# Patient Record
Sex: Male | Born: 1990
Health system: Southern US, Community
[De-identification: ages and names within clinical notes are randomized; demographics above are authoritative.]

## PROBLEM LIST (undated history)

## (undated) DIAGNOSIS — F419 Anxiety disorder, unspecified: Secondary | ICD-10-CM

## (undated) DIAGNOSIS — F32A Depression, unspecified: Secondary | ICD-10-CM

## (undated) HISTORY — DX: Depression, unspecified: F32.A

## (undated) HISTORY — DX: Anxiety disorder, unspecified: F41.9

## (undated) HISTORY — PX: KNEE SURGERY: SHX244

---
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
## 2020-08-23 ENCOUNTER — Other Ambulatory Visit: Payer: Self-pay

## 2020-08-23 ENCOUNTER — Encounter: Payer: Self-pay | Admitting: Family Medicine

## 2020-08-23 ENCOUNTER — Ambulatory Visit (INDEPENDENT_AMBULATORY_CARE_PROVIDER_SITE_OTHER): Payer: BC Managed Care – PPO | Admitting: Family Medicine

## 2020-08-23 VITALS — BP 110/74 | HR 75 | Temp 97.7°F | Ht 69.0 in | Wt 244.0 lb

## 2020-08-23 DIAGNOSIS — K645 Perianal venous thrombosis: Secondary | ICD-10-CM | POA: Diagnosis not present

## 2020-08-23 MED ORDER — HYDROCORTISONE (PERIANAL) 2.5 % EX CREA
1.0000 | TOPICAL_CREAM | Freq: Two times a day (BID) | CUTANEOUS | 2 refills | Status: DC
Start: 2020-08-23 — End: 2022-03-27

## 2020-08-23 MED ORDER — WITCH HAZEL-GLYCERIN EX PADS: MEDICATED_PAD | CUTANEOUS | 12 refills | Status: DC

## 2020-08-23 NOTE — Progress Notes (Signed)
Established Patient Office Visit  Subjective:  Patient ID: Edward Yu, male    DOB: 1990-09-19  Age: 30 y.o. MRN: [id number]  CC:  Chief Complaint  Patient presents with   Establish Care    NP/establish care bump at rectum x 4 days tender to touch, becoming larger in size black in color.     HPI Edward Yu presents for evaluation of a 4-day history of a tender swelling in his rectum.  Developed severe pain last night and felt a pop and the pain was relieved.  Patient denies prolonged toilet bowl setting or constipation.  He stools up to 4 times daily.  Denies hematochezia or black tarry stools.  Denies weight loss.  Denies crampy abdominal pain.  Stools are formed.  Denies painful bowel movements other than with a tender swelling.  History reviewed. No pertinent past medical history.  Past Surgical History:  Procedure Laterality Date   KNEE SURGERY Right     Family History  Problem Relation Age of Onset   Arthritis Mother    Hernia Father    Hypertension Maternal Grandmother    Heart disease Maternal Grandmother    Thyroid disease Maternal Grandmother    Depression Paternal Grandmother    Kidney disease Paternal Grandmother    Juvenile Rhematoid Arthritis Paternal Grandmother     Social History   Socioeconomic History   Marital status: Single    Spouse name: Not on file   Number of children: Not on file   Years of education: Not on file   Highest education level: Not on file  Occupational History   Not on file  Tobacco Use   Smoking status: Never   Smokeless tobacco: Never  Vaping Use   Vaping Use: Never used  Substance and Sexual Activity   Alcohol use: Yes    Comment: rare   Drug use: Never   Sexual activity: Yes  Other Topics Concern   Not on file  Social History Narrative   Not on file   Social Determinants of Health   Financial Resource Strain: Not on file  Food Insecurity: Not on file  Transportation Needs: Not on file  Physical  Activity: Not on file  Stress: Not on file  Social Connections: Not on file  Intimate Partner Violence: Not on file    No outpatient medications prior to visit.   No facility-administered medications prior to visit.    Not on File  ROS Review of Systems  Constitutional: Negative.   Respiratory: Negative.    Cardiovascular: Negative.   Gastrointestinal: Negative.  Negative for abdominal pain, anal bleeding, blood in stool, constipation and diarrhea.  Psychiatric/Behavioral: Negative.       Objective:    Physical Exam Vitals and nursing note reviewed.  Constitutional:      General: He is not in acute distress.    Appearance: Normal appearance. He is obese. He is not ill-appearing, toxic-appearing or diaphoretic.  HENT:     Head: Normocephalic and atraumatic.     Right Ear: External ear normal.     Left Ear: External ear normal.  Eyes:     General: No scleral icterus.       Right eye: No discharge.        Left eye: No discharge.     Extraocular Movements: Extraocular movements intact.     Conjunctiva/sclera: Conjunctivae normal.  Cardiovascular:     Rate and Rhythm: Normal rate and regular rhythm.  Pulmonary:  Effort: Pulmonary effort is normal.     Breath sounds: Normal breath sounds.  Abdominal:     General: Bowel sounds are normal. There is no distension.     Palpations: Abdomen is soft. There is no mass.     Tenderness: There is no abdominal tenderness. There is no guarding or rebound.     Hernia: No hernia is present.  Genitourinary:   Skin:    General: Skin is warm and dry.  Neurological:     Mental Status: He is alert and oriented to person, place, and time.  Psychiatric:        Mood and Affect: Mood normal.        Behavior: Behavior normal.    BP 110/74 (BP Location: Left Arm, Patient Position: Sitting, Cuff Size: Normal)   Pulse 75   Temp 97.7 F (36.5 C) (Temporal)   Ht '5\' 9"'  (1.753 m) Comment: 5 9 1/2  Wt 244 lb (110.7 kg)   SpO2 98%    BMI 36.03 kg/m  Wt Readings from Last 3 Encounters:  08/23/20 244 lb (110.7 kg)     Health Maintenance Due  Topic Date Due   HIV Screening  Never done   Hepatitis C Screening  Never done   INFLUENZA VACCINE  08/06/2020    There are no preventive care reminders to display for this patient.  No results found for: TSH No results found for: WBC, HGB, HCT, MCV, PLT No results found for: NA, K, CHLORIDE, CO2, GLUCOSE, BUN, CREATININE, BILITOT, ALKPHOS, AST, ALT, PROT, ALBUMIN, CALCIUM, ANIONGAP, EGFR, GFR No results found for: CHOL No results found for: HDL No results found for: LDLCALC No results found for: TRIG No results found for: CHOLHDL No results found for: HGBA1C    Assessment & Plan:   Problem List Items Addressed This Visit       Cardiovascular and Mediastinum   Thrombosed hemorrhoids - Primary   Relevant Medications   hydrocortisone (ANUSOL-HC) 2.5 % rectal cream   witch hazel-glycerin (TUCKS) pad   Other Relevant Orders   Ambulatory referral to Gastroenterology    Meds ordered this encounter  Medications   hydrocortisone (ANUSOL-HC) 2.5 % rectal cream    Sig: Place 1 application rectally 2 (two) times daily.    Dispense:  30 g    Refill:  2   witch hazel-glycerin (TUCKS) pad    Sig: Use after stooling.    Dispense:  40 each    Refill:  12    Follow-up: No follow-ups on file.   We will treat with Anusol cream with hydrocortisone and witch hazel pads after stooling.  Difficult to correlate hemorrhoid with frequent stooling other than frequent stooling could lead to prolonged toilet bowl sitting.  Will return for physical. Edward Maw, MD

## 2020-10-31 ENCOUNTER — Encounter: Payer: BC Managed Care – PPO | Admitting: Family Medicine

## 2021-03-14 ENCOUNTER — Ambulatory Visit (INDEPENDENT_AMBULATORY_CARE_PROVIDER_SITE_OTHER): Payer: BC Managed Care – PPO | Admitting: Nurse Practitioner

## 2021-03-14 ENCOUNTER — Encounter: Payer: Self-pay | Admitting: Nurse Practitioner

## 2021-03-14 ENCOUNTER — Other Ambulatory Visit: Payer: Self-pay

## 2021-03-14 VITALS — BP 118/72 | HR 55 | Temp 98.5°F | Wt 227.2 lb

## 2021-03-14 DIAGNOSIS — J029 Acute pharyngitis, unspecified: Secondary | ICD-10-CM | POA: Insufficient documentation

## 2021-03-14 DIAGNOSIS — J020 Streptococcal pharyngitis: Secondary | ICD-10-CM

## 2021-03-14 LAB — POCT RAPID STREP A (OFFICE): Rapid Strep A Screen: POSITIVE — AB

## 2021-03-14 LAB — POCT INFLUENZA A/B
Influenza A, POC: NEGATIVE
Influenza B, POC: NEGATIVE

## 2021-03-14 LAB — POC COVID19 BINAXNOW: SARS Coronavirus 2 Ag: NEGATIVE

## 2021-03-14 MED ORDER — AMOXICILLIN 500 MG PO CAPS: 500.0000 mg | ORAL_CAPSULE | Freq: Two times a day (BID) | ORAL | 0 refills | Status: AC

## 2021-03-14 NOTE — Patient Instructions (Signed)
It was great to see you! ? ?I will call you with the results of your strep, flu, and covid-19 tests. Keep taking ibuprofen as needed for pain. You can take benadryl (may make you sleepy) or tylenol cold and sinus or dayquil (or store brand) during the day.  ? ?Let's follow-up if your symptoms don't improve or worsen.  ? ?Take care, ? ?Rodman Pickle, NP ? ?

## 2021-03-14 NOTE — Progress Notes (Signed)
? ?Acute Office Visit ? ?Subjective:  ? ? Patient ID: Edward Yu, male    DOB: 1990-12-28, 31 y.o.   MRN: [id number] ? ?Chief Complaint  ?Patient presents with  ? Sinusitis  ?  Pt c/o sinus pressure, headache, body aches, fever, body sweats all started today  ? ? ?HPI ?Patient is in today for fever, body aches, and sinus pressure since today. ? ?UPPER RESPIRATORY TRACT INFECTION ? ?Fever: yes 103 ?Cough: no ?Shortness of breath: no ?Wheezing: no ?Chest pain: no ?Chest tightness: no ?Chest congestion: no ?Nasal congestion: yes ?Runny nose: no ?Post nasal drip: no ?Sneezing: no ?Sore throat: yes ?Swollen glands: no ?Sinus pressure: yes ?Headache: yes ?Face pain: no ?Toothache: no ?Ear pain: no bilateral ?Ear pressure: yes bilateral ?Eyes red/itching:no ?Eye drainage/crusting: no  ?Vomiting: no ?Rash: no ?Fatigue: yes ?Sick contacts: no ?Strep contacts: yes - daughter ?Context: stable ?Recurrent sinusitis: no ?Relief with OTC cold/cough medications: yes  ?Treatments attempted: benadryl, ibuprofen  ? ?History reviewed. No pertinent past medical history. ? ?Past Surgical History:  ?Procedure Laterality Date  ? KNEE SURGERY Right   ? ? ?Family History  ?Problem Relation Age of Onset  ? Arthritis Mother   ? Hernia Father   ? Hypertension Maternal Grandmother   ? Heart disease Maternal Grandmother   ? Thyroid disease Maternal Grandmother   ? Depression Paternal Grandmother   ? Kidney disease Paternal Grandmother   ? Juvenile Rhematoid Arthritis Paternal Grandmother   ? ? ?Social History  ? ?Socioeconomic History  ? Marital status: Single  ?  Spouse name: Not on file  ? Number of children: Not on file  ? Years of education: Not on file  ? Highest education level: Not on file  ?Occupational History  ? Not on file  ?Tobacco Use  ? Smoking status: Never  ? Smokeless tobacco: Never  ?Vaping Use  ? Vaping Use: Never used  ?Substance and Sexual Activity  ? Alcohol use: Yes  ?  Comment: rare  ? Drug use: Never  ? Sexual  activity: Yes  ?Other Topics Concern  ? Not on file  ?Social History Narrative  ? Not on file  ? ?Social Determinants of Health  ? ?Financial Resource Strain: Not on file  ?Food Insecurity: Not on file  ?Transportation Needs: Not on file  ?Physical Activity: Not on file  ?Stress: Not on file  ?Social Connections: Not on file  ?Intimate Partner Violence: Not on file  ? ? ?Outpatient Medications Prior to Visit  ?Medication Sig Dispense Refill  ? hydrocortisone (ANUSOL-HC) 2.5 % rectal cream Place 1 application rectally 2 (two) times daily. 30 g 2  ? witch hazel-glycerin (TUCKS) pad Use after stooling. 40 each 12  ? ?No facility-administered medications prior to visit.  ? ? ?Not on File ? ?Review of Systems ?See pertinent positives and negatives per HPI. ?   ?Objective:  ?  ?Physical Exam ?Vitals and nursing note reviewed.  ?Constitutional:   ?   Appearance: Normal appearance.  ?HENT:  ?   Head: Normocephalic.  ?   Right Ear: Tympanic membrane, ear canal and external ear normal.  ?   Left Ear: Tympanic membrane, ear canal and external ear normal.  ?   Mouth/Throat:  ?   Pharynx: Posterior oropharyngeal erythema present. No oropharyngeal exudate.  ?Eyes:  ?   Conjunctiva/sclera: Conjunctivae normal.  ?Cardiovascular:  ?   Rate and Rhythm: Normal rate and regular rhythm.  ?   Pulses: Normal pulses.  ?  Heart sounds: Normal heart sounds.  ?Pulmonary:  ?   Effort: Pulmonary effort is normal.  ?   Breath sounds: Normal breath sounds.  ?Musculoskeletal:  ?   Cervical back: Normal range of motion and neck supple. No tenderness.  ?Lymphadenopathy:  ?   Cervical: No cervical adenopathy.  ?Skin: ?   General: Skin is warm.  ?Neurological:  ?   General: No focal deficit present.  ?   Mental Status: He is alert and oriented to person, place, and time.  ?Psychiatric:     ?   Mood and Affect: Mood normal.     ?   Behavior: Behavior normal.     ?   Thought Content: Thought content normal.     ?   Judgment: Judgment normal.  ? ? ?BP  118/72 (BP Location: Right Arm, Cuff Size: Normal)   Pulse (!) 55   Temp 98.5 ?F (36.9 ?C) (Temporal)   Wt 227 lb 3.2 oz (103.1 kg)   SpO2 98%   BMI 33.55 kg/m?  ?Wt Readings from Last 3 Encounters:  ?03/14/21 227 lb 3.2 oz (103.1 kg)  ?08/23/20 244 lb (110.7 kg)  ? ? ?Health Maintenance Due  ?Topic Date Due  ? HIV Screening  Never done  ? Hepatitis C Screening  Never done  ? INFLUENZA VACCINE  Never done  ? ? ?There are no preventive care reminders to display for this patient. ? ? ?No results found for: TSH ?No results found for: WBC, HGB, HCT, MCV, PLT ?No results found for: NA, K, CHLORIDE, CO2, GLUCOSE, BUN, CREATININE, BILITOT, ALKPHOS, AST, ALT, PROT, ALBUMIN, CALCIUM, ANIONGAP, EGFR, GFR ?No results found for: CHOL ?No results found for: HDL ?No results found for: [place] ?No results found for: TRIG ?No results found for: CHOLHDL ?No results found for: HGBA1C ? ?   ?Assessment & Plan:  ? ?Problem List Items Addressed This Visit   ? ?  ? Other  ? RESOLVED: Sore throat  ? Relevant Orders  ? POCT Influenza A/B (Completed)  ? POCT rapid strep A (Completed)  ? POC COVID-19 BinaxNow (Completed)  ? ?Other Visit Diagnoses   ? ? Strep pharyngitis    -  Primary  ? Treat with amoxicillin 500 mg twice daily x10 days.  Can take ibuprofen as needed for pain and fever.  He is contagious x24 hours.  F/U if not improving  ? ?  ? ? ? ?Meds ordered this encounter  ?Medications  ? amoxicillin (AMOXIL) 500 MG capsule  ?  Sig: Take 1 capsule (500 mg total) by mouth 2 (two) times daily for 10 days.  ?  Dispense:  20 capsule  ?  Refill:  0  ? ? ? ?Charyl Dancer, NP ? ?

## 2022-01-08 DIAGNOSIS — R278 Other lack of coordination: Secondary | ICD-10-CM | POA: Diagnosis not present

## 2022-01-08 DIAGNOSIS — F800 Phonological disorder: Secondary | ICD-10-CM | POA: Diagnosis not present

## 2022-01-08 DIAGNOSIS — F802 Mixed receptive-expressive language disorder: Secondary | ICD-10-CM | POA: Diagnosis not present

## 2022-01-15 DIAGNOSIS — R278 Other lack of coordination: Secondary | ICD-10-CM | POA: Diagnosis not present

## 2022-01-15 DIAGNOSIS — F802 Mixed receptive-expressive language disorder: Secondary | ICD-10-CM | POA: Diagnosis not present

## 2022-01-15 DIAGNOSIS — F800 Phonological disorder: Secondary | ICD-10-CM | POA: Diagnosis not present

## 2022-01-22 DIAGNOSIS — F800 Phonological disorder: Secondary | ICD-10-CM | POA: Diagnosis not present

## 2022-01-22 DIAGNOSIS — R278 Other lack of coordination: Secondary | ICD-10-CM | POA: Diagnosis not present

## 2022-01-22 DIAGNOSIS — F802 Mixed receptive-expressive language disorder: Secondary | ICD-10-CM | POA: Diagnosis not present

## 2022-02-03 DIAGNOSIS — F802 Mixed receptive-expressive language disorder: Secondary | ICD-10-CM | POA: Diagnosis not present

## 2022-02-03 DIAGNOSIS — R278 Other lack of coordination: Secondary | ICD-10-CM | POA: Diagnosis not present

## 2022-02-03 DIAGNOSIS — F800 Phonological disorder: Secondary | ICD-10-CM | POA: Diagnosis not present

## 2022-02-12 DIAGNOSIS — F802 Mixed receptive-expressive language disorder: Secondary | ICD-10-CM | POA: Diagnosis not present

## 2022-02-12 DIAGNOSIS — R278 Other lack of coordination: Secondary | ICD-10-CM | POA: Diagnosis not present

## 2022-02-12 DIAGNOSIS — F800 Phonological disorder: Secondary | ICD-10-CM | POA: Diagnosis not present

## 2022-02-17 DIAGNOSIS — F800 Phonological disorder: Secondary | ICD-10-CM | POA: Diagnosis not present

## 2022-02-17 DIAGNOSIS — R278 Other lack of coordination: Secondary | ICD-10-CM | POA: Diagnosis not present

## 2022-02-17 DIAGNOSIS — F802 Mixed receptive-expressive language disorder: Secondary | ICD-10-CM | POA: Diagnosis not present

## 2022-03-05 DIAGNOSIS — F800 Phonological disorder: Secondary | ICD-10-CM | POA: Diagnosis not present

## 2022-03-05 DIAGNOSIS — F802 Mixed receptive-expressive language disorder: Secondary | ICD-10-CM | POA: Diagnosis not present

## 2022-03-05 DIAGNOSIS — R278 Other lack of coordination: Secondary | ICD-10-CM | POA: Diagnosis not present

## 2022-03-17 DIAGNOSIS — F802 Mixed receptive-expressive language disorder: Secondary | ICD-10-CM | POA: Diagnosis not present

## 2022-03-17 DIAGNOSIS — F800 Phonological disorder: Secondary | ICD-10-CM | POA: Diagnosis not present

## 2022-03-27 ENCOUNTER — Ambulatory Visit: Payer: BC Managed Care – PPO | Admitting: Family Medicine

## 2022-03-27 ENCOUNTER — Encounter: Payer: Self-pay | Admitting: Family Medicine

## 2022-03-27 VITALS — BP 120/72 | HR 79 | Temp 98.7°F | Ht 69.0 in | Wt 244.6 lb

## 2022-03-27 DIAGNOSIS — J029 Acute pharyngitis, unspecified: Secondary | ICD-10-CM

## 2022-03-27 DIAGNOSIS — B349 Viral infection, unspecified: Secondary | ICD-10-CM | POA: Diagnosis not present

## 2022-03-27 LAB — POCT RAPID STREP A (OFFICE): Rapid Strep A Screen: NEGATIVE

## 2022-03-27 NOTE — Progress Notes (Signed)
Established Patient Office Visit   Subjective:  Patient ID: Edward Yu, male    DOB: September 03, 1990  Age: 32 y.o. MRN: [id number]  Chief Complaint  Patient presents with   Fever    Fever, cough, sore throat symptoms x 2 days. Lots of drainage and pressure     Fever  Associated symptoms include congestion, coughing, headaches, nausea and a sore throat. Pertinent negatives include no vomiting or wheezing.   Encounter Diagnoses  Name Primary?   Viral syndrome Yes   Pharyngitis, unspecified etiology    2-day history of congestion postnasal drip sore throat and cough.  Earlier in the illness he had run a fever that responded to Tylenol and Advil.  He had experienced a headache with myalgias and arthralgias.  There have also been some nausea with the fever.  Fever has since broken.   Review of Systems  Constitutional:  Positive for fever and malaise/fatigue.  HENT:  Positive for congestion and sore throat.   Respiratory:  Positive for cough. Negative for sputum production, shortness of breath and wheezing.   Gastrointestinal:  Positive for nausea. Negative for vomiting.  Musculoskeletal:  Positive for joint pain and myalgias.  Neurological:  Positive for headaches.    No current outpatient medications on file.   Objective:     BP 120/72 (BP Location: Left Arm, Patient Position: Sitting, Cuff Size: Large)   Pulse 79   Temp 98.7 F (37.1 C) (Temporal)   Ht 5\' 9"  (1.753 m)   Wt 244 lb 9.6 oz (110.9 kg)   SpO2 96%   BMI 36.12 kg/m    Physical Exam Constitutional:      General: He is not in acute distress.    Appearance: Normal appearance. He is not ill-appearing, toxic-appearing or diaphoretic.  HENT:     Head: Normocephalic and atraumatic.     Right Ear: Tympanic membrane, ear canal and external ear normal.     Left Ear: Tympanic membrane, ear canal and external ear normal.     Mouth/Throat:     Mouth: Mucous membranes are moist.     Pharynx: Oropharynx is clear. No  oropharyngeal exudate or posterior oropharyngeal erythema.  Eyes:     General: No scleral icterus.       Right eye: No discharge.        Left eye: No discharge.     Extraocular Movements: Extraocular movements intact.     Conjunctiva/sclera: Conjunctivae normal.     Pupils: Pupils are equal, round, and reactive to light.  Cardiovascular:     Rate and Rhythm: Normal rate and regular rhythm.  Pulmonary:     Effort: Pulmonary effort is normal. No respiratory distress.     Breath sounds: Normal breath sounds. No wheezing, rhonchi or rales.  Abdominal:     General: Bowel sounds are normal.  Musculoskeletal:     Cervical back: No rigidity or tenderness.  Skin:    General: Skin is warm and dry.  Neurological:     Mental Status: He is alert and oriented to person, place, and time.  Psychiatric:        Mood and Affect: Mood normal.        Behavior: Behavior normal.      No results found for any visits on 03/27/22.    The ASCVD Risk score (Arnett DK, et al., 2019) failed to calculate for the following reasons:   The 2019 ASCVD risk score is only valid for ages 28 to 58  Assessment & Plan:   Viral syndrome  Pharyngitis, unspecified etiology -     POCT rapid strep A    Return if symptoms worsen or fail to improve.  Rest.  Lots of fluids.  Follow-up in 1 week if not improving.  Libby Maw, MD

## 2022-04-01 ENCOUNTER — Ambulatory Visit: Payer: BC Managed Care – PPO | Admitting: Family Medicine

## 2022-04-01 ENCOUNTER — Encounter: Payer: Self-pay | Admitting: Family Medicine

## 2022-04-01 VITALS — BP 116/70 | HR 95 | Temp 98.7°F | Ht 69.0 in | Wt 245.4 lb

## 2022-04-01 DIAGNOSIS — H6992 Unspecified Eustachian tube disorder, left ear: Secondary | ICD-10-CM | POA: Diagnosis not present

## 2022-04-01 DIAGNOSIS — H6121 Impacted cerumen, right ear: Secondary | ICD-10-CM | POA: Insufficient documentation

## 2022-04-01 MED ORDER — EAR WAX CLEANSING 6.5 % OT KIT: PACK | OTIC | 1 refills | Status: DC

## 2022-04-01 MED ORDER — PREDNISONE 20 MG PO TABS: 20.0000 mg | ORAL_TABLET | Freq: Two times a day (BID) | ORAL | 0 refills | Status: AC

## 2022-04-01 NOTE — Progress Notes (Signed)
Established Patient Office Visit   Subjective:  Patient ID: Edward Yu, male    DOB: Oct 07, 1990  Age: 32 y.o. MRN: [id number]  Chief Complaint  Patient presents with   Sinus Problem    Left ear clogged feels muffled x 2 days, sore throat lots of mucus and sinus drainage x 1 week.     Sinus Problem Associated symptoms include congestion and coughing. Pertinent negatives include no ear pain or headaches.   Encounter Diagnoses  Name Primary?   Dysfunction of left eustachian tube Yes   Excessive cerumen in right ear canal    URI is resolving but he has been left with left ear congestion.  He denies pain, history of otitis media but hearing has been affected.  Denies fevers chills, facial pressure, rhinorrhea or teeth pain.  No difficulty breathing or wheezing   Review of Systems  Constitutional: Negative.   HENT:  Positive for congestion and hearing loss. Negative for ear discharge, ear pain, sinus pain and tinnitus.   Eyes:  Negative for blurred vision, discharge and redness.  Respiratory:  Positive for cough.   Cardiovascular: Negative.   Gastrointestinal:  Negative for abdominal pain.  Genitourinary: Negative.   Musculoskeletal: Negative.  Negative for joint pain and myalgias.  Skin:  Negative for rash.  Neurological:  Negative for tingling, loss of consciousness, weakness and headaches.  Endo/Heme/Allergies:  Negative for polydipsia.      04/01/2022    4:14 PM 03/27/2022    9:44 AM 08/24/2020    8:16 AM  Depression screen PHQ 2/9  Decreased Interest 0 0 0  Down, Depressed, Hopeless 0 0 0  PHQ - 2 Score 0 0 0  Altered sleeping   3  Tired, decreased energy   2  Change in appetite   3  Feeling bad or failure about yourself    0  Trouble concentrating   3  Moving slowly or fidgety/restless   0  Suicidal thoughts   0  PHQ-9 Score   11  Difficult doing work/chores   Somewhat difficult      Current Outpatient Medications:    Carbamide Peroxide-Saline (EAR WAX  CLEANSING) 6.5 % KIT, Follow directions on kit for right ear., Disp: 1 kit, Rfl: 1   predniSONE (DELTASONE) 20 MG tablet, Take 1 tablet (20 mg total) by mouth 2 (two) times daily with a meal for 7 days., Disp: 14 tablet, Rfl: 0   Objective:     BP 116/70 (BP Location: Left Arm, Patient Position: Sitting, Cuff Size: Large)   Pulse 95   Temp 98.7 F (37.1 C) (Temporal)   Ht 5\' 9"  (1.753 m)   Wt 245 lb 6.4 oz (111.3 kg)   SpO2 98%   BMI 36.24 kg/m    Physical Exam Constitutional:      General: He is not in acute distress.    Appearance: Normal appearance. He is not ill-appearing, toxic-appearing or diaphoretic.  HENT:     Head: Normocephalic and atraumatic.     Right Ear: External ear normal. No middle ear effusion. Tympanic membrane is not injected, erythematous or retracted.     Left Ear: External ear normal.  No middle ear effusion. Tympanic membrane is retracted. Tympanic membrane is not injected or erythematous.     Ears:     Comments: There is cerumen in the right ear canal. Eyes:     General: No scleral icterus.       Right eye: No discharge.  Left eye: No discharge.     Extraocular Movements: Extraocular movements intact.     Conjunctiva/sclera: Conjunctivae normal.  Pulmonary:     Effort: Pulmonary effort is normal. No respiratory distress.  Skin:    General: Skin is warm and dry.  Neurological:     Mental Status: He is alert and oriented to person, place, and time.  Psychiatric:        Mood and Affect: Mood normal.        Behavior: Behavior normal.      No results found for any visits on 04/01/22.    The ASCVD Risk score (Arnett DK, et al., 2019) failed to calculate for the following reasons:   The 2019 ASCVD risk score is only valid for ages 8 to 97    Assessment & Plan:   Dysfunction of left eustachian tube -     predniSONE; Take 1 tablet (20 mg total) by mouth 2 (two) times daily with a meal for 7 days.  Dispense: 14 tablet; Refill:  0  Excessive cerumen in right ear canal -     Ear Wax Cleansing; Follow directions on kit for right ear.  Dispense: 1 kit; Refill: 1  Prednisone 20 twice daily for 7 days.  If not improving.  Earwax removal kit right ear.  Return if symptoms worsen or fail to improve.    Libby Maw, MD

## 2022-04-09 DIAGNOSIS — R278 Other lack of coordination: Secondary | ICD-10-CM | POA: Diagnosis not present

## 2022-04-09 DIAGNOSIS — Q8719 Other congenital malformation syndromes predominantly associated with short stature: Secondary | ICD-10-CM | POA: Diagnosis not present

## 2022-04-09 DIAGNOSIS — F800 Phonological disorder: Secondary | ICD-10-CM | POA: Diagnosis not present

## 2022-04-09 DIAGNOSIS — F802 Mixed receptive-expressive language disorder: Secondary | ICD-10-CM | POA: Diagnosis not present

## 2022-04-23 DIAGNOSIS — Q8719 Other congenital malformation syndromes predominantly associated with short stature: Secondary | ICD-10-CM | POA: Diagnosis not present

## 2022-04-23 DIAGNOSIS — F802 Mixed receptive-expressive language disorder: Secondary | ICD-10-CM | POA: Diagnosis not present

## 2022-04-23 DIAGNOSIS — F800 Phonological disorder: Secondary | ICD-10-CM | POA: Diagnosis not present

## 2022-04-23 DIAGNOSIS — R278 Other lack of coordination: Secondary | ICD-10-CM | POA: Diagnosis not present

## 2022-04-28 DIAGNOSIS — F802 Mixed receptive-expressive language disorder: Secondary | ICD-10-CM | POA: Diagnosis not present

## 2022-04-28 DIAGNOSIS — F800 Phonological disorder: Secondary | ICD-10-CM | POA: Diagnosis not present

## 2022-04-28 DIAGNOSIS — Q8719 Other congenital malformation syndromes predominantly associated with short stature: Secondary | ICD-10-CM | POA: Diagnosis not present

## 2022-04-28 DIAGNOSIS — R278 Other lack of coordination: Secondary | ICD-10-CM | POA: Diagnosis not present

## 2022-04-30 DIAGNOSIS — F800 Phonological disorder: Secondary | ICD-10-CM | POA: Diagnosis not present

## 2022-04-30 DIAGNOSIS — F802 Mixed receptive-expressive language disorder: Secondary | ICD-10-CM | POA: Diagnosis not present

## 2022-04-30 DIAGNOSIS — R278 Other lack of coordination: Secondary | ICD-10-CM | POA: Diagnosis not present

## 2022-04-30 DIAGNOSIS — Q8719 Other congenital malformation syndromes predominantly associated with short stature: Secondary | ICD-10-CM | POA: Diagnosis not present

## 2022-05-07 DIAGNOSIS — F800 Phonological disorder: Secondary | ICD-10-CM | POA: Diagnosis not present

## 2022-05-07 DIAGNOSIS — R278 Other lack of coordination: Secondary | ICD-10-CM | POA: Diagnosis not present

## 2022-05-07 DIAGNOSIS — F802 Mixed receptive-expressive language disorder: Secondary | ICD-10-CM | POA: Diagnosis not present

## 2022-05-07 DIAGNOSIS — Q8719 Other congenital malformation syndromes predominantly associated with short stature: Secondary | ICD-10-CM | POA: Diagnosis not present

## 2022-05-12 DIAGNOSIS — F800 Phonological disorder: Secondary | ICD-10-CM | POA: Diagnosis not present

## 2022-05-12 DIAGNOSIS — Q8719 Other congenital malformation syndromes predominantly associated with short stature: Secondary | ICD-10-CM | POA: Diagnosis not present

## 2022-05-12 DIAGNOSIS — F802 Mixed receptive-expressive language disorder: Secondary | ICD-10-CM | POA: Diagnosis not present

## 2022-05-12 DIAGNOSIS — R278 Other lack of coordination: Secondary | ICD-10-CM | POA: Diagnosis not present

## 2022-05-14 DIAGNOSIS — F800 Phonological disorder: Secondary | ICD-10-CM | POA: Diagnosis not present

## 2022-05-14 DIAGNOSIS — Q8719 Other congenital malformation syndromes predominantly associated with short stature: Secondary | ICD-10-CM | POA: Diagnosis not present

## 2022-05-14 DIAGNOSIS — F802 Mixed receptive-expressive language disorder: Secondary | ICD-10-CM | POA: Diagnosis not present

## 2022-05-14 DIAGNOSIS — R278 Other lack of coordination: Secondary | ICD-10-CM | POA: Diagnosis not present

## 2022-05-21 DIAGNOSIS — Q8719 Other congenital malformation syndromes predominantly associated with short stature: Secondary | ICD-10-CM | POA: Diagnosis not present

## 2022-05-21 DIAGNOSIS — F800 Phonological disorder: Secondary | ICD-10-CM | POA: Diagnosis not present

## 2022-05-21 DIAGNOSIS — R278 Other lack of coordination: Secondary | ICD-10-CM | POA: Diagnosis not present

## 2022-05-21 DIAGNOSIS — F802 Mixed receptive-expressive language disorder: Secondary | ICD-10-CM | POA: Diagnosis not present

## 2022-05-28 DIAGNOSIS — F800 Phonological disorder: Secondary | ICD-10-CM | POA: Diagnosis not present

## 2022-05-28 DIAGNOSIS — F802 Mixed receptive-expressive language disorder: Secondary | ICD-10-CM | POA: Diagnosis not present

## 2022-05-28 DIAGNOSIS — R278 Other lack of coordination: Secondary | ICD-10-CM | POA: Diagnosis not present

## 2022-05-28 DIAGNOSIS — Q8719 Other congenital malformation syndromes predominantly associated with short stature: Secondary | ICD-10-CM | POA: Diagnosis not present

## 2022-06-11 DIAGNOSIS — Q8719 Other congenital malformation syndromes predominantly associated with short stature: Secondary | ICD-10-CM | POA: Diagnosis not present

## 2022-06-11 DIAGNOSIS — R278 Other lack of coordination: Secondary | ICD-10-CM | POA: Diagnosis not present

## 2022-06-18 DIAGNOSIS — R278 Other lack of coordination: Secondary | ICD-10-CM | POA: Diagnosis not present

## 2022-06-18 DIAGNOSIS — Q8719 Other congenital malformation syndromes predominantly associated with short stature: Secondary | ICD-10-CM | POA: Diagnosis not present

## 2022-07-02 DIAGNOSIS — R278 Other lack of coordination: Secondary | ICD-10-CM | POA: Diagnosis not present

## 2022-07-02 DIAGNOSIS — Q8719 Other congenital malformation syndromes predominantly associated with short stature: Secondary | ICD-10-CM | POA: Diagnosis not present

## 2022-07-16 DIAGNOSIS — R278 Other lack of coordination: Secondary | ICD-10-CM | POA: Diagnosis not present

## 2022-07-16 DIAGNOSIS — Q8719 Other congenital malformation syndromes predominantly associated with short stature: Secondary | ICD-10-CM | POA: Diagnosis not present

## 2022-07-20 DIAGNOSIS — W01198A Fall on same level from slipping, tripping and stumbling with subsequent striking against other object, initial encounter: Secondary | ICD-10-CM | POA: Diagnosis not present

## 2022-07-20 DIAGNOSIS — S0081XA Abrasion of other part of head, initial encounter: Secondary | ICD-10-CM | POA: Diagnosis not present

## 2022-07-20 DIAGNOSIS — S0993XA Unspecified injury of face, initial encounter: Secondary | ICD-10-CM | POA: Diagnosis not present

## 2022-08-06 DIAGNOSIS — R278 Other lack of coordination: Secondary | ICD-10-CM | POA: Diagnosis not present

## 2022-08-06 DIAGNOSIS — Q8719 Other congenital malformation syndromes predominantly associated with short stature: Secondary | ICD-10-CM | POA: Diagnosis not present

## 2022-08-07 ENCOUNTER — Encounter: Payer: Self-pay | Admitting: Behavioral Health

## 2022-08-07 ENCOUNTER — Ambulatory Visit (INDEPENDENT_AMBULATORY_CARE_PROVIDER_SITE_OTHER): Payer: BC Managed Care – PPO | Admitting: Behavioral Health

## 2022-08-07 VITALS — BP 126/80 | HR 67 | Ht 70.0 in | Wt 230.0 lb

## 2022-08-07 DIAGNOSIS — F331 Major depressive disorder, recurrent, moderate: Secondary | ICD-10-CM | POA: Diagnosis not present

## 2022-08-07 DIAGNOSIS — F411 Generalized anxiety disorder: Secondary | ICD-10-CM | POA: Diagnosis not present

## 2022-08-07 MED ORDER — TRAZODONE HCL 50 MG PO TABS: 50.0000 mg | ORAL_TABLET | Freq: Every day | ORAL | 1 refills | Status: DC

## 2022-08-07 MED ORDER — VILAZODONE HCL 20 MG PO TABS: ORAL_TABLET | ORAL | 1 refills | Status: DC

## 2022-08-07 NOTE — Progress Notes (Signed)
[facility] MD/PA/NP Initial Note  08/07/2022 12:08 PM Edward Yu  MRN:  [id number]  Chief Complaint:  Chief Complaint   Anxiety; Depression; Family Problem; Follow-up; Patient Education     HPI:   "Edward Yu", 32 year old male presents to this office for initial visit and to establish care.  Collateral information should be considered reliable.  Patient is tearful at times.  He says, "my brain runs all the time".  Says that it affects his conversations and he feels disengaged.  Says that he works 60 hours plus weeks and does not have any time for himself.  He is lost interest in a lot of the hobbies that he use to do.  He endorses racing thoughts, trouble concentrating, easily distracted.  Says that he is working on his current 9-year relationship with significant other.  Says there has been some on fake fullness involved in the past and he is not sure that he can overcome his feelings.  Patient says that his anxiety today is 6/10, and depression is 4/10.  Says that his sleep has been inconsistent and poor recently only getting about 5 to 6 hours per night.  His MDQ had 4/14 criteria on marked yes.  His PHQ-9 score was 16.  He denies any history of mania, no psychosis, no auditory or visual hallucinations.  Denies SI or HI.  No past psychiatric medication trials    Visit Diagnosis:    ICD-10-CM   1. Generalized anxiety disorder  F41.1 Vilazodone HCl 20 MG TABS    traZODone (DESYREL) 50 MG tablet    2. Major depressive disorder, recurrent episode, moderate (HCC)  F33.1 Vilazodone HCl 20 MG TABS    traZODone (DESYREL) 50 MG tablet      Past Psychiatric History: none  Past Medical History: No past medical history on file.  Past Surgical History:  Procedure Laterality Date   KNEE SURGERY Right     Family Psychiatric History: see chart  Family History:  Family History  Problem Relation Age of Onset   Arthritis Mother    Anxiety disorder Father    Hernia Father    Bipolar  disorder Sister    Hypertension Maternal Grandmother    Heart disease Maternal Grandmother    Thyroid disease Maternal Grandmother    Depression Paternal Grandmother    Kidney disease Paternal Grandmother    Juvenile Rhematoid Arthritis Paternal Grandmother     Social History:  Social History   Socioeconomic History   Marital status: Significant Other    Spouse name: Edward Yu   Number of children: 2   Years of education: 12   Highest education level: High school graduate  Occupational History   Occupation: Works at [employer] and [employer]  Tobacco Use   Smoking status: Never   Smokeless tobacco: Never  Vaping Use   Vaping status: Never Used  Substance and Sexual Activity   Alcohol use: Yes    Comment: rare   Drug use: Never   Sexual activity: Yes  Other Topics Concern   Not on file  Social History Narrative   Lives in [place] with significant other and two young children 8 and 5.  Works 60 hours per week between two jobs. Enjoys Pharmacist, community, plays music in free time. Attends [employer].         Social Determinants of Health   Financial Resource Strain: Not on file  Food Insecurity: Not on file  Transportation Needs: Not on file  Physical Activity: Not on file  Stress: Not on file  Social Connections: Not on file    Allergies: Not on File  Metabolic Disorder Labs: No results found for: "HGBA1C", "MPG" No results found for: "PROLACTIN" No results found for: "CHOL", "TRIG", "HDL", "CHOLHDL", "VLDL", "LDLCALC" No results found for: "TSH"  Therapeutic Level Labs: No results found for: "LITHIUM" No results found for: "VALPROATE" No results found for: "CBMZ"  Current Medications: Current Outpatient Medications  Medication Sig Dispense Refill   traZODone (DESYREL) 50 MG tablet Take 1 tablet (50 mg total) by mouth at bedtime. 30 tablet 1   Vilazodone HCl 20 MG TABS Take 1/2 tablet by mouth for 7 days, then one whole tablet. 30 tablet 1    Carbamide Peroxide-Saline (EAR WAX CLEANSING) 6.5 % KIT Follow directions on kit for right ear. 1 kit 1   No current facility-administered medications for this visit.    Medication Side Effects: none  Orders placed this visit:  No orders of the defined types were placed in this encounter.   Psychiatric Specialty Exam:  Review of Systems  Constitutional:  Positive for diaphoresis.  Respiratory:  Positive for shortness of breath.   Musculoskeletal:  Positive for back pain and neck pain.  Allergic/Immunologic: Negative.     Blood pressure 126/80, pulse 67, height 5\' 10"  (1.778 m), weight 230 lb (104.3 kg).Body mass index is 33 kg/m.  General Appearance: Casual, Neat, and Well Groomed  Eye Contact:  Good  Speech:  Clear and Coherent  Volume:  Normal  Mood:  Anxious, Depressed, and Dysphoric  Affect:  Congruent, Depressed, Tearful, and Anxious  Thought Process:  Coherent  Orientation:  Full (Time, Place, and Person)  Thought Content: Logical   Suicidal Thoughts:  No  Homicidal Thoughts:  No  Memory:  WNL  Judgement:  Good  Insight:  Good  Psychomotor Activity:  Normal  Concentration:  Concentration: Good  Recall:  Good  Fund of Knowledge: Fair  Language: Good  Assets:  Desire for Improvement  ADL's:  Intact  Cognition: WNL  Prognosis:  Good   Screenings:  GAD-7    Flowsheet Row Office Visit from 08/23/2020 in [facility] [employer]  Total GAD-7 Score 9      PHQ2-9    Flowsheet Row Office Visit from 08/07/2022 in [facility] Office Visit from 04/01/2022 in [facility] Visit from 03/27/2022 in [facility] Visit from 08/23/2020 in [facility] [employer]  PHQ-2 Total Score 4 0 0 0  PHQ-9 Total Score 16 -- -- 11       Receiving Psychotherapy: No   Treatment Plan/Recommendations:   Greater  than 50% of face to face time with patient was spent on counseling and coordination of care. We discussed his current problems with anxiety and depression.  Talked about his family and relationship dynamics.  I recommended that he seek couples counseling.  We talked about possible medication options and side effect profiles.  He is not sought mental health care in the past.  We agreed to: Will start Viibryd 10 mg for 7 days, then 20 mg daily.  Must take with food. Will start trazodone 50 mg at bedtime for sleep Will follow-up in 4 weeks to reassess Provided emergency contact information Will report side effects or worsening symptoms Reviewed PDMP      Joan Flores, NP

## 2022-08-18 ENCOUNTER — Ambulatory Visit: Payer: BC Managed Care – PPO | Admitting: Family Medicine

## 2022-08-18 ENCOUNTER — Encounter: Payer: Self-pay | Admitting: Family Medicine

## 2022-08-18 VITALS — BP 126/80 | HR 80 | Temp 98.4°F | Ht 70.0 in | Wt 235.8 lb

## 2022-08-18 DIAGNOSIS — Z8614 Personal history of Methicillin resistant Staphylococcus aureus infection: Secondary | ICD-10-CM | POA: Diagnosis not present

## 2022-08-18 DIAGNOSIS — L089 Local infection of the skin and subcutaneous tissue, unspecified: Secondary | ICD-10-CM

## 2022-08-18 DIAGNOSIS — B9689 Other specified bacterial agents as the cause of diseases classified elsewhere: Secondary | ICD-10-CM

## 2022-08-18 MED ORDER — CLINDAMYCIN HCL 300 MG PO CAPS: 300.0000 mg | ORAL_CAPSULE | Freq: Three times a day (TID) | ORAL | 0 refills | Status: AC

## 2022-08-18 NOTE — Progress Notes (Signed)
[facility] [address] [place] [address] Dept: [phone] Dept Fax: [phone]  Office Visit  Subjective:    Patient ID: Edward Yu, male    DOB: 11/07/1990, 33 y.o..   MRN: [id number]  Chief Complaint  Patient presents with   Rash    C/o having several bumps on both legs x 1 week.    Has been using antibiotic cream on it.    History of Present Illness:  Patient is in today with several infected sores on his lower legs. He notes these have been present for about a week. The lesion on his right ankle had a pustule present yesterday.However, this has ruptured and now scabbed over. This have only been very mildly pruritic. He notes he has a past history of significant MRSA infection of his right knee in the past. He has been using a topical antibiotic cream, without improvement.  Past Medical History: Patient Active Problem List   Diagnosis Date Noted   Dysfunction of left eustachian tube 04/01/2022   Excessive cerumen in right ear canal 04/01/2022   Viral syndrome 03/27/2022   Pharyngitis 03/14/2021   Thrombosed hemorrhoids 08/23/2020   Past Surgical History:  Procedure Laterality Date   KNEE SURGERY Right    Family History  Problem Relation Age of Onset   Arthritis Mother    Anxiety disorder Father    Hernia Father    Bipolar disorder Sister    Hypertension Maternal Grandmother    Heart disease Maternal Grandmother    Thyroid disease Maternal Grandmother    Depression Paternal Grandmother    Kidney disease Paternal Grandmother    Juvenile Rhematoid Arthritis Paternal Grandmother    Outpatient Medications Prior to Visit  Medication Sig Dispense Refill   traZODone (DESYREL) 50 MG tablet Take 1 tablet (50 mg total) by mouth at bedtime. 30 tablet 1   Vilazodone HCl 20 MG TABS Take 1/2 tablet by mouth for 7 days, then one whole tablet. 30 tablet 1   Carbamide Peroxide-Saline (EAR WAX CLEANSING) 6.5 % KIT  Follow directions on kit for right ear. 1 kit 1   No facility-administered medications prior to visit.   No Known Allergies   Objective:   Today's Vitals   08/18/22 1057  BP: 126/80  Pulse: 80  Temp: 98.4 F (36.9 C)  TempSrc: Temporal  SpO2: 98%  Weight: 235 lb 12.8 oz (107 kg)  Height: 5\' 10"  (1.778 m)   Body mass index is 33.83 kg/m.   General: Well developed, well nourished. No acute distress. Skin: Warm and dry. There are three scattered lesions ont he lwoer legs. These have an area of   erythema and induration with a central scabbed lesion. Psych: Alert and oriented. Normal mood and affect.  Health Maintenance Due  Topic Date Due   HIV Screening  Never done   Hepatitis C Screening  Never done     Assessment & Plan:   Problem List Items Addressed This Visit   None Visit Diagnoses     Localized bacterial skin infection    -  Primary   These certainly may be localized MRSA lesions. I will treat with a course of clindamycin. Recommend he use an antibactrial soap for prevention.   Relevant Medications   clindamycin (CLEOCIN) 300 MG capsule   History of MRSA infection       Relevant Medications   clindamycin (CLEOCIN) 300 MG capsule       Return if symptoms  worsen or fail to improve.   Loyola Mast, MD

## 2022-08-20 ENCOUNTER — Telehealth: Payer: Self-pay | Admitting: Family Medicine

## 2022-08-20 DIAGNOSIS — B9689 Other specified bacterial agents as the cause of diseases classified elsewhere: Secondary | ICD-10-CM

## 2022-08-20 DIAGNOSIS — Z8614 Personal history of Methicillin resistant Staphylococcus aureus infection: Secondary | ICD-10-CM

## 2022-08-20 NOTE — Telephone Encounter (Signed)
Pt called and stated that the bumps MRSA is not getting better the bumps are getting bigger. He would like to know if he should continue with the med, or have it increased or come in to see you. Please advise

## 2022-08-22 ENCOUNTER — Ambulatory Visit: Payer: BC Managed Care – PPO | Admitting: Family Medicine

## 2022-08-22 ENCOUNTER — Encounter: Payer: Self-pay | Admitting: Family Medicine

## 2022-08-22 VITALS — BP 122/84 | HR 69 | Temp 98.6°F | Ht 70.0 in | Wt 237.0 lb

## 2022-08-22 DIAGNOSIS — Z8614 Personal history of Methicillin resistant Staphylococcus aureus infection: Secondary | ICD-10-CM

## 2022-08-22 MED ORDER — CHLORHEXIDINE GLUCONATE 4 % EX SOLN: CUTANEOUS | 1 refills | Status: AC

## 2022-08-22 MED ORDER — MUPIROCIN 2 % EX OINT: TOPICAL_OINTMENT | CUTANEOUS | 0 refills | Status: AC

## 2022-08-22 NOTE — Progress Notes (Signed)
Established Patient Office Visit   Subjective:  Patient ID: Edward Yu, male    DOB: 1990/08/23  Age: 32 y.o. MRN: [id number]  No chief complaint on file.   HPI Encounter Diagnoses  Name Primary?   History of MRSA infection Yes   For follow-up of above.  Doing well with the Cleocin and has been compliant.  Lesions are drying up.  Significant other is not affected fortunately.   Review of Systems  Constitutional: Negative.   HENT: Negative.    Eyes:  Negative for blurred vision, discharge and redness.  Respiratory: Negative.    Cardiovascular: Negative.   Gastrointestinal:  Negative for abdominal pain.  Genitourinary: Negative.   Musculoskeletal: Negative.  Negative for myalgias.  Skin:  Positive for rash. Negative for itching.  Neurological:  Negative for tingling, loss of consciousness and weakness.  Endo/Heme/Allergies:  Negative for polydipsia.     Current Outpatient Medications:    chlorhexidine (HIBICLENS) 4 % external liquid, Apply a thin coat to entire body from neck down and wash off in shower 45 minutes later.  Repeat in 4 days., Disp: 236 mL, Rfl: 1   clindamycin (CLEOCIN) 300 MG capsule, Take 1 capsule (300 mg total) by mouth 3 (three) times daily., Disp: 21 capsule, Rfl: 0   mupirocin ointment (BACTROBAN) 2 %, Apply one half a gram to each nare twice daily for 5 days., Disp: 10 g, Rfl: 0   traZODone (DESYREL) 50 MG tablet, Take 1 tablet (50 mg total) by mouth at bedtime., Disp: 30 tablet, Rfl: 1   Vilazodone HCl 20 MG TABS, Take 1/2 tablet by mouth for 7 days, then one whole tablet., Disp: 30 tablet, Rfl: 1   Objective:     BP 122/84   Pulse 69   Temp 98.6 F (37 C)   Ht 5\' 10"  (1.778 m)   Wt 237 lb (107.5 kg)   SpO2 99%   BMI 34.01 kg/m    Physical Exam Constitutional:      General: He is not in acute distress.    Appearance: Normal appearance. He is not ill-appearing, toxic-appearing or diaphoretic.  HENT:     Head: Normocephalic and  atraumatic.     Right Ear: External ear normal.     Left Ear: External ear normal.  Eyes:     General: No scleral icterus.       Right eye: No discharge.        Left eye: No discharge.     Extraocular Movements: Extraocular movements intact.     Conjunctiva/sclera: Conjunctivae normal.  Pulmonary:     Effort: Pulmonary effort is normal. No respiratory distress.  Skin:    General: Skin is warm and dry.     Comments: Previous lesions of concern are now dried and crusted.  There is no associated erythema streaking or discharge.  Neurological:     Mental Status: He is alert and oriented to person, place, and time.  Psychiatric:        Mood and Affect: Mood normal.        Behavior: Behavior normal.      No results found for any visits on 08/22/22.    The ASCVD Risk score (Arnett DK, et al., 2019) failed to calculate for the following reasons:   The 2019 ASCVD risk score is only valid for ages 58 to 60    Assessment & Plan:   History of MRSA infection -     Mupirocin; Apply one half a  gram to each nare twice daily for 5 days.  Dispense: 10 g; Refill: 0 -     Chlorhexidine Gluconate; Apply a thin coat to entire body from neck down and wash off in shower 45 minutes later.  Repeat in 4 days.  Dispense: 236 mL; Refill: 1    Return if symptoms worsen or fail to improve.  Information was given on MRSA.  Bactroban ointment to nares and chlorhexidine body wash to reset the body flora.  Hopefully this will prevent further infections.  Mliss Sax, MD

## 2022-08-28 MED ORDER — DOXYCYCLINE HYCLATE 100 MG PO TABS: 100.0000 mg | ORAL_TABLET | Freq: Two times a day (BID) | ORAL | 0 refills | Status: AC

## 2022-08-29 ENCOUNTER — Other Ambulatory Visit: Payer: Self-pay | Admitting: Behavioral Health

## 2022-08-29 DIAGNOSIS — F331 Major depressive disorder, recurrent, moderate: Secondary | ICD-10-CM

## 2022-08-29 DIAGNOSIS — F411 Generalized anxiety disorder: Secondary | ICD-10-CM

## 2022-09-01 NOTE — Telephone Encounter (Signed)
Attempted to contact the patient but voicemail was full.

## 2022-09-04 ENCOUNTER — Encounter: Payer: Self-pay | Admitting: Behavioral Health

## 2022-09-04 ENCOUNTER — Ambulatory Visit (INDEPENDENT_AMBULATORY_CARE_PROVIDER_SITE_OTHER): Payer: BC Managed Care – PPO | Admitting: Behavioral Health

## 2022-09-04 DIAGNOSIS — F331 Major depressive disorder, recurrent, moderate: Secondary | ICD-10-CM | POA: Diagnosis not present

## 2022-09-04 DIAGNOSIS — F411 Generalized anxiety disorder: Secondary | ICD-10-CM | POA: Diagnosis not present

## 2022-09-04 MED ORDER — VILAZODONE HCL 20 MG PO TABS: ORAL_TABLET | ORAL | 1 refills | Status: DC

## 2022-09-04 NOTE — Progress Notes (Signed)
[name]  Patient ID: Edward Yu,  MRN: [id number]  PCP: Mliss Sax, MD  Date of Evaluation: 09/04/2022 Time spent:30 minutes  Chief Complaint:  Chief Complaint   Anxiety; Depression; Follow-up; Patient Education     HISTORY/CURRENT STATUS: HPI   "Edward Yu", 32 year old male presents to this office for follow up and medication management. Collateral information should be considered reliable. Pt is not tearful this visit. Feels like his depression and anxiety has improved. He is concern about Autism testing and ADHD. He is requesting referral or information about.   Patient says that his anxiety today is 3/10, and depression is 3/10.  Sleep quality has improved with Trazodone and he is waking up minimally throughout the night.  He denies any history of mania, no psychosis, no auditory or visual hallucinations.  Denies SI or HI.   No past psychiatric medication trials         Individual Medical History/ Review of Systems: Changes? :No   Allergies: Patient has no known allergies.  Current Medications:  Current Outpatient Medications:    chlorhexidine (HIBICLENS) 4 % external liquid, Apply a thin coat to entire body from neck down and wash off in shower 45 minutes later.  Repeat in 4 days., Disp: 236 mL, Rfl: 1   clindamycin (CLEOCIN) 300 MG capsule, Take 1 capsule (300 mg total) by mouth 3 (three) times daily., Disp: 21 capsule, Rfl: 0   doxycycline (VIBRA-TABS) 100 MG tablet, Take 1 tablet (100 mg total) by mouth 2 (two) times daily for 10 days., Disp: 20 tablet, Rfl: 0   mupirocin ointment (BACTROBAN) 2 %, Apply one half a gram to each nare twice daily for 5 days., Disp: 10 g, Rfl: 0   traZODone (DESYREL) 50 MG tablet, TAKE 1 TABLET BY MOUTH EVERYDAY AT BEDTIME, Disp: 90 tablet, Rfl: 1   Vilazodone HCl 20 MG TABS, Take one tablet by mouth daily. Must take with food., Disp: 30 tablet, Rfl: 1 Medication Side Effects: none  Family Medical/ Social  History: Changes? No  MENTAL HEALTH EXAM:  There were no vitals taken for this visit.There is no height or weight on file to calculate BMI.  General Appearance: Casual, Neat, and Well Groomed  Eye Contact:  Good  Speech:  Clear and Coherent  Volume:  Normal  Mood:  Anxious, Depressed, and Dysphoric  Affect:  Appropriate  Thought Process:  Coherent  Orientation:  Full (Time, Place, and Person)  Thought Content: Logical   Suicidal Thoughts:  No  Homicidal Thoughts:  No  Memory:  WNL  Judgement:  Good  Insight:  Good  Psychomotor Activity:  Normal  Concentration:  Concentration: Good  Recall:  Good  Fund of Knowledge: Good  Language: Good  Assets:  Desire for Improvement  ADL's:  Intact  Cognition: WNL  Prognosis:  Good    DIAGNOSES:    ICD-10-CM   1. Generalized anxiety disorder  F41.1 Vilazodone HCl 20 MG TABS    2. Major depressive disorder, recurrent episode, moderate (HCC)  F33.1 Vilazodone HCl 20 MG TABS      Receiving Psychotherapy: No    RECOMMENDATIONS:   Greater than 50% of face to face time with patient was spent on counseling and coordination of care. We talked about his moderate improvement with Anxiety and Depression.   We talked about his concerns with having Autism or ADHD. I provided resources for testing. [facility] and [place] [facility]. We agreed to: Continue  Viibryd 20 mg  daily.  Must take with food. Continue trazodone 50 mg at bedtime for sleep Will follow-up in 4 weeks to reassess Provided emergency contact information Will report side effects or worsening symptoms Reviewed PDMP     Joan Flores, NP

## 2022-10-02 ENCOUNTER — Ambulatory Visit: Payer: BC Managed Care – PPO | Admitting: Behavioral Health

## 2022-10-02 ENCOUNTER — Encounter: Payer: Self-pay | Admitting: Behavioral Health

## 2022-10-02 DIAGNOSIS — F331 Major depressive disorder, recurrent, moderate: Secondary | ICD-10-CM | POA: Diagnosis not present

## 2022-10-02 DIAGNOSIS — F411 Generalized anxiety disorder: Secondary | ICD-10-CM | POA: Diagnosis not present

## 2022-10-02 MED ORDER — VILAZODONE HCL 20 MG PO TABS: ORAL_TABLET | ORAL | 1 refills | Status: DC

## 2022-10-02 NOTE — Progress Notes (Signed)
[name]  Patient ID: Edward Yu,  MRN: [id number]  PCP: Mliss Sax, MD  Date of Evaluation: 10/02/2022 Time spent:30 minutes  Chief Complaint:  Chief Complaint   Depression; Anxiety; Follow-up; Medication Refill; Patient Education     HISTORY/CURRENT STATUS: HPI  "Edward Yu", 32 year old male presents to this office for follow up and medication management.  Collateral information should be considered reliable.  Patient is tearful at times.  He has scheduled appt with [facility]. Says he is doing ok but not able to start medication because of cost but will be able to pick up from pharmacy tomorrow.  Patient says that his anxiety today is 7/10, and depression is 5/10.  Says that his sleep has been inconsistent and poor recently only getting about 5 to 6 hours per night.  He denies any history of mania, no psychosis, no auditory or visual hallucinations.  Denies SI or HI.   No past psychiatric medication trials    Individual Medical History/ Review of Systems: Changes? :No   Allergies: Patient has no known allergies.  Current Medications:  Current Outpatient Medications:    chlorhexidine (HIBICLENS) 4 % external liquid, Apply a thin coat to entire body from neck down and wash off in shower 45 minutes later.  Repeat in 4 days., Disp: 236 mL, Rfl: 1   clindamycin (CLEOCIN) 300 MG capsule, Take 1 capsule (300 mg total) by mouth 3 (three) times daily., Disp: 21 capsule, Rfl: 0   mupirocin ointment (BACTROBAN) 2 %, Apply one half a gram to each nare twice daily for 5 days., Disp: 10 g, Rfl: 0   traZODone (DESYREL) 50 MG tablet, TAKE 1 TABLET BY MOUTH EVERYDAY AT BEDTIME, Disp: 90 tablet, Rfl: 1   Vilazodone HCl 20 MG TABS, Take one tablet by mouth daily. Must take with food., Disp: 90 tablet, Rfl: 1 Medication Side Effects: none  Family Medical/ Social History: Changes? No  MENTAL HEALTH EXAM:  There were no vitals taken for this  visit.There is no height or weight on file to calculate BMI.  General Appearance: Casual and Neat  Eye Contact:  Good  Speech:  Clear and Coherent  Volume:  Normal  Mood:  NA  Affect:  Appropriate  Thought Process:  Coherent  Orientation:  Negative  Thought Content: Logical   Suicidal Thoughts:  No  Homicidal Thoughts:  No  Memory:  WNL  Judgement:  Good  Insight:  Good  Psychomotor Activity:  Normal  Concentration:  Concentration: Good  Recall:  Good  Fund of Knowledge: Good  Language: Good  Assets:  Desire for Improvement  ADL's:  Intact  Cognition: WNL  Prognosis:  Good    DIAGNOSES:    ICD-10-CM   1. Generalized anxiety disorder  F41.1 Vilazodone HCl 20 MG TABS    2. Major depressive disorder, recurrent episode, moderate (HCC)  F33.1 Vilazodone HCl 20 MG TABS      Receiving Psychotherapy: No    RECOMMENDATIONS:   Greater than 50% of face to face time with patient was spent on counseling and coordination of care. We discussed his current problems with anxiety and depression. He was unable to start his medication because he could not afford. Says that he will start tomorrow. He understands that compliance with the medication is very important.    We agreed to: Will start Viibryd 10 mg for 7 days, then 20 mg daily.  Must take with food. Will start trazodone 50 mg at bedtime for sleep  Will follow-up in 4 weeks to reassess Provided emergency contact information Will report side effects or worsening symptoms Reviewed PDMP      Joan Flores, NP

## 2022-10-14 DIAGNOSIS — R278 Other lack of coordination: Secondary | ICD-10-CM | POA: Diagnosis not present

## 2022-10-14 DIAGNOSIS — Q8719 Other congenital malformation syndromes predominantly associated with short stature: Secondary | ICD-10-CM | POA: Diagnosis not present

## 2022-10-30 ENCOUNTER — Encounter: Payer: Self-pay | Admitting: Behavioral Health

## 2022-10-30 ENCOUNTER — Ambulatory Visit: Payer: BC Managed Care – PPO | Admitting: Behavioral Health

## 2022-10-30 DIAGNOSIS — F411 Generalized anxiety disorder: Secondary | ICD-10-CM

## 2022-10-30 DIAGNOSIS — F331 Major depressive disorder, recurrent, moderate: Secondary | ICD-10-CM | POA: Diagnosis not present

## 2022-10-30 MED ORDER — VILAZODONE HCL 20 MG PO TABS: ORAL_TABLET | ORAL | 1 refills | Status: DC

## 2022-10-30 MED ORDER — TRAZODONE HCL 50 MG PO TABS: 50.0000 mg | ORAL_TABLET | Freq: Every day | ORAL | 1 refills | Status: DC

## 2022-10-30 NOTE — Progress Notes (Signed)
[name]  Patient ID: KY CLUM,  MRN: [id number]  PCP: Mliss Sax, MD  Date of Evaluation: 10/30/2022 Time spent:30 minutes  Chief Complaint:  Chief Complaint   Depression; Anxiety; Follow-up; Medication Refill; Patient Education     HISTORY/CURRENT STATUS: HPI "Edward Yu", 32 year old male presents to this office for follow up and medication management.  Collateral information should be considered reliable.   He has scheduled appt with [facility]. Says he has not been taking medication as prescribed until 2 weeks ago. He understands it is difficult to reassess with the inconsistency. Agrees to improve until next visit. He wants to wait till after his [facility] appt before following up again due to financial concerns.  Patient says that his anxiety today is 5/10, and depression is 4/10.  Says that his sleep has been inconsistent and poor recently only getting about 6-7 hours per night.  He denies any history of mania, no psychosis, no auditory or visual hallucinations.  Denies SI or HI.   No past psychiatric medication trials       Individual Medical History/ Review of Systems: Changes? :No   Allergies: Patient has no known allergies.  Current Medications:  Current Outpatient Medications:    chlorhexidine (HIBICLENS) 4 % external liquid, Apply a thin coat to entire body from neck down and wash off in shower 45 minutes later.  Repeat in 4 days., Disp: 236 mL, Rfl: 1   clindamycin (CLEOCIN) 300 MG capsule, Take 1 capsule (300 mg total) by mouth 3 (three) times daily., Disp: 21 capsule, Rfl: 0   mupirocin ointment (BACTROBAN) 2 %, Apply one half a gram to each nare twice daily for 5 days., Disp: 10 g, Rfl: 0   traZODone (DESYREL) 50 MG tablet, Take 1 tablet (50 mg total) by mouth at bedtime., Disp: 90 tablet, Rfl: 1   Vilazodone HCl 20 MG TABS, Take one tablet by mouth daily. Must take with food., Disp: 90 tablet, Rfl:  1 Medication Side Effects: none  Family Medical/ Social History: Changes? No  MENTAL HEALTH EXAM:  There were no vitals taken for this visit.There is no height or weight on file to calculate BMI.  General Appearance: Casual, Neat, and Well Groomed  Eye Contact:  Good  Speech:  Clear and Coherent  Volume:  Normal  Mood:  Anxious and Depressed  Affect:  Appropriate  Thought Process:  Coherent  Orientation:  Full (Time, Place, and Person)  Thought Content: Logical   Suicidal Thoughts:  No  Homicidal Thoughts:  No  Memory:  WNL  Judgement:  Good  Insight:  Good  Psychomotor Activity:  Normal  Concentration:  Concentration: Good  Recall:  Good  Fund of Knowledge: Good  Language: Good  Assets:  Desire for Improvement  ADL's:  Intact  Cognition: WNL  Prognosis:  Good    DIAGNOSES:    ICD-10-CM   1. Generalized anxiety disorder  F41.1 Vilazodone HCl 20 MG TABS    traZODone (DESYREL) 50 MG tablet    2. Major depressive disorder, recurrent episode, moderate (HCC)  F33.1 Vilazodone HCl 20 MG TABS    traZODone (DESYREL) 50 MG tablet      Receiving Psychotherapy:    RECOMMENDATIONS:   Greater than 50% of face to face time with patient was spent on counseling and coordination of care. We discussed his current problems with anxiety and depression. We discussed his process with [facility]. He will be getting appointment soon. We talked about  his inconsistency of not taking medication as prescribed. He agrees to take everyday.  We agreed to: Continue Viibryd 20 mg daily.  Must take with food. To continue trazodone 50 mg at bedtime for sleep Will follow-up in 12 weeks to reassess Provided emergency contact information Will report side effects or worsening symptoms Reviewed PDMP      Joan Flores, NP

## 2022-11-26 DIAGNOSIS — R41840 Attention and concentration deficit: Secondary | ICD-10-CM | POA: Diagnosis not present

## 2022-11-26 DIAGNOSIS — Z79899 Other long term (current) drug therapy: Secondary | ICD-10-CM | POA: Diagnosis not present

## 2022-12-03 DIAGNOSIS — R41840 Attention and concentration deficit: Secondary | ICD-10-CM | POA: Diagnosis not present

## 2022-12-03 DIAGNOSIS — F338 Other recurrent depressive disorders: Secondary | ICD-10-CM | POA: Diagnosis not present

## 2022-12-03 DIAGNOSIS — F419 Anxiety disorder, unspecified: Secondary | ICD-10-CM | POA: Diagnosis not present

## 2023-01-02 DIAGNOSIS — R41840 Attention and concentration deficit: Secondary | ICD-10-CM | POA: Diagnosis not present

## 2023-01-06 DIAGNOSIS — Z79899 Other long term (current) drug therapy: Secondary | ICD-10-CM | POA: Diagnosis not present

## 2023-01-06 DIAGNOSIS — F902 Attention-deficit hyperactivity disorder, combined type: Secondary | ICD-10-CM | POA: Diagnosis not present

## 2023-01-30 ENCOUNTER — Ambulatory Visit (INDEPENDENT_AMBULATORY_CARE_PROVIDER_SITE_OTHER): Payer: BC Managed Care – PPO | Admitting: Behavioral Health

## 2023-01-30 ENCOUNTER — Encounter: Payer: Self-pay | Admitting: Behavioral Health

## 2023-01-30 DIAGNOSIS — F39 Unspecified mood [affective] disorder: Secondary | ICD-10-CM | POA: Diagnosis not present

## 2023-01-30 DIAGNOSIS — F411 Generalized anxiety disorder: Secondary | ICD-10-CM

## 2023-01-30 DIAGNOSIS — F331 Major depressive disorder, recurrent, moderate: Secondary | ICD-10-CM | POA: Diagnosis not present

## 2023-01-30 DIAGNOSIS — F332 Major depressive disorder, recurrent severe without psychotic features: Secondary | ICD-10-CM | POA: Diagnosis not present

## 2023-01-30 MED ORDER — CARIPRAZINE HCL 1.5 MG PO CAPS: 1.5000 mg | ORAL_CAPSULE | Freq: Every day | ORAL | 1 refills | Status: DC

## 2023-01-30 MED ORDER — VILAZODONE HCL 20 MG PO TABS: ORAL_TABLET | ORAL | 1 refills | Status: DC

## 2023-01-30 MED ORDER — TRAZODONE HCL 50 MG PO TABS: 50.0000 mg | ORAL_TABLET | Freq: Every day | ORAL | 1 refills | Status: AC

## 2023-01-30 NOTE — Progress Notes (Signed)
[name]  Patient ID: MANCE VALLEJO,  MRN: [id number]  PCP: Mliss Sax, MD  Date of Evaluation: 01/30/2023 Time spent:30 minutes  Chief Complaint:  Chief Complaint   ADHD; Anxiety; Medication Refill; Patient Education; Follow-up; Depression; Medication Problem     HISTORY/CURRENT STATUS: HPI  "Edward Yu", 33 year old male presents to this office for follow up and medication management.  Collateral information should be considered reliable.   His mother Edward Yu is with him today with verbal consent.  He appears distressed, anxious and hyper verbal.  Tangential thoughts as he discusses non related subjects. He becomes tearful and mom explains that since he started Vyvanse he has become worse.  Says that he gets fixated on things, has racing thoughts and more talkative when he gets upset. Says she notices increased impulsivity and he has been spending money irresponsibly. Says that Bipolar runs in the family and his sister is severe. She wonders if stimulants have trigger underlying mood disorder. Says that he has been consistent in taking his Viibryd as prescribed the last few weeks.  Says he also has evaluation scheduled for Autism soon but does not know exact date. Patient says that his anxiety today is 7/10, and depression is 7/10.  Says that his sleep has been inconsistent and poor recently only getting about 6-7 hours per night.  He denies any history of mania, no psychosis, no auditory or visual hallucinations.  Denies SI or HI.   No past psychiatric medication trials Vyvanse Viibryd- partial response  Trazodone  Individual Medical History/ Review of Systems: Changes? :No   Allergies: Patient has no known allergies.  Current Medications:  Current Outpatient Medications:    cariprazine (VRAYLAR) 1.5 MG capsule, Take 1 capsule (1.5 mg total) by mouth daily., Disp: 30 capsule, Rfl: 1   chlorhexidine (HIBICLENS) 4 % external liquid, Apply a thin coat to entire  body from neck down and wash off in shower 45 minutes later.  Repeat in 4 days., Disp: 236 mL, Rfl: 1   clindamycin (CLEOCIN) 300 MG capsule, Take 1 capsule (300 mg total) by mouth 3 (three) times daily., Disp: 21 capsule, Rfl: 0   mupirocin ointment (BACTROBAN) 2 %, Apply one half a gram to each nare twice daily for 5 days., Disp: 10 g, Rfl: 0   traZODone (DESYREL) 50 MG tablet, Take 1 tablet (50 mg total) by mouth at bedtime., Disp: 90 tablet, Rfl: 1   Vilazodone HCl 20 MG TABS, Take one tablet by mouth daily. Must take with food., Disp: 30 tablet, Rfl: 1 Medication Side Effects: none  Family Medical/ Social History: Changes? No  MENTAL HEALTH EXAM:  There were no vitals taken for this visit.There is no height or weight on file to calculate BMI.  General Appearance: Casual and Neat  Eye Contact:  Good  Speech:  Clear and Coherent  Volume:  Normal  Mood:  NA  Affect:  Appropriate  Thought Process:  Coherent  Orientation:  Full (Time, Place, and Person)  Thought Content: Logical   Suicidal Thoughts:  No  Homicidal Thoughts:  No  Memory:  WNL  Judgement:  Good  Insight:  Good  Psychomotor Activity:  Normal  Concentration:  Concentration: Good  Recall:  Good  Fund of Knowledge: Good  Language: Good  Assets:  Desire for Improvement  ADL's:  Intact  Cognition: WNL  Prognosis:  Good    DIAGNOSES:    ICD-10-CM   1. Severe episode of recurrent major depressive disorder, without psychotic features (  HCC)  F33.2 cariprazine (VRAYLAR) 1.5 MG capsule    2. Generalized anxiety disorder  F41.1 cariprazine (VRAYLAR) 1.5 MG capsule    traZODone (DESYREL) 50 MG tablet    Vilazodone HCl 20 MG TABS    3. Unspecified mood (affective) disorder (HCC)  F39 cariprazine (VRAYLAR) 1.5 MG capsule    4. Major depressive disorder, recurrent episode, moderate (HCC)  F33.1 traZODone (DESYREL) 50 MG tablet    Vilazodone HCl 20 MG TABS      Receiving Psychotherapy: No    RECOMMENDATIONS:    Greater than 50% of face to face time with patient was spent on counseling and coordination of care. We discussed his recent instability and emotional shifts. Talked about Vyvanse  increasing problems with racing thoughts and moods. Reviewed his medications and discussed family hx in depth. Mother provided more valuable collateral information. I am concerned that Vyvanse has triggered mixed mania and depression. Not prepared for diagnosis of mood disorder but depression is severe today.  I advised him that he should stop Vyvanse for now and that we may revaluate at later date.   We agreed to: Will start Vraylar 1.5 mg in the am after breakfast. Two weeks samples and RX drug card provided. Reviewed possible side effect profiles with patient.  Will stop Vyvanse 40 mg chewable tablet promptly Continue Viibryd 20 mg daily.  Must take with food. To continue trazodone 50 mg at bedtime for sleep Will follow-up in 4 weeks to reassess Provided emergency contact information Will report side effects or worsening symptoms Discussed potential benefits, risks, and side effects of stimulants with patient to include increased heart rate, palpitations, insomnia, increased anxiety, increased irritability, or decreased appetite.  Instructed patient to contact office if experiencing any significant tolerability issues.  Discussed potential metabolic side effects associated with atypical antipsychotics, as well as potential risk for movement side effects. Advised pt to contact office if movement side effects occur.   Reviewed PDM  Joan Flores, NP

## 2023-02-03 ENCOUNTER — Telehealth: Payer: Self-pay

## 2023-02-03 NOTE — Telephone Encounter (Signed)
Prior authorization submitted for Vraylar 1.5 mg , approval received effective 02/03/23-02/02/25  [employer]

## 2023-02-13 ENCOUNTER — Telehealth: Payer: Self-pay

## 2023-02-13 NOTE — Telephone Encounter (Signed)
 Patient notified of recommendations.

## 2023-02-13 NOTE — Telephone Encounter (Signed)
 Pt last seen 1/24 and started on Vraylar .  He is reporting pretty much all the same sx he did at this appt. He reports being super hyper, restless, tense, and tired. He called out of work 1/2 day one day, but goes to work daily, works 2 jobs. Reports getting 5-6 hours of sleep but doesn't feel rested. His mind won't shut off at night. Depression rated a 6-7/10, anxiety 8/10. 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 Mom said he is able to go to work, tend to ADLs, he is just tired.   Will start Vraylar  1.5 mg in the am after breakfast. Two weeks samples and RX drug card provided. Reviewed possible side effect profiles with patient.  Will stop Vyvanse 40 mg chewable tablet promptly Continue Viibryd  20 mg daily.  Must take with food. To continue trazodone  50 mg at bedtime for sleep

## 2023-02-27 ENCOUNTER — Ambulatory Visit: Payer: BC Managed Care – PPO | Admitting: Behavioral Health

## 2023-02-27 ENCOUNTER — Encounter: Payer: Self-pay | Admitting: Behavioral Health

## 2023-02-27 DIAGNOSIS — F331 Major depressive disorder, recurrent, moderate: Secondary | ICD-10-CM

## 2023-02-27 DIAGNOSIS — F332 Major depressive disorder, recurrent severe without psychotic features: Secondary | ICD-10-CM

## 2023-02-27 DIAGNOSIS — F411 Generalized anxiety disorder: Secondary | ICD-10-CM | POA: Diagnosis not present

## 2023-02-27 DIAGNOSIS — F39 Unspecified mood [affective] disorder: Secondary | ICD-10-CM

## 2023-02-27 MED ORDER — VILAZODONE HCL 20 MG PO TABS: ORAL_TABLET | ORAL | 1 refills | Status: DC

## 2023-02-27 MED ORDER — CARIPRAZINE HCL 1.5 MG PO CAPS: 1.5000 mg | ORAL_CAPSULE | Freq: Every day | ORAL | 2 refills | Status: DC

## 2023-02-27 NOTE — Progress Notes (Signed)
 [name]  Patient ID: [name],  MRN: [id number]  PCP: Mliss Sax, MD  Date of Evaluation: 02/27/2023 Time spent:30 minutes  Chief Complaint:  Chief Complaint   Anxiety; Depression; Follow-up; Medication Refill; Patient Education     HISTORY/CURRENT STATUS: HPI  "Edward Yu", 33 year old male presents to this office for follow up and medication management.  Collateral information should be considered reliable.   His mother Edward Yu is with him today with verbal consent.  Does not appear distressed today. Speech is not tangential as in last visit. Pt is calm and more articulate. He has been taking his Vraylar 1.5 mg dose every other day but says he is ready to try every day dosing. Has to take at night because it makes him sleepy. Patient says that his anxiety today is 4/10, and depression is 4/10.  Says that his sleep has been inconsistent and poor recently only getting about 6-7 hours per night.  He denies any history of mania, no psychosis, no auditory or visual hallucinations.  Denies SI or HI.   No past psychiatric medication trials Vyvanse Viibryd- partial response  Trazodone     Individual Medical History/ Review of Systems: Changes? :No   Allergies: Patient has no known allergies.  Current Medications:  Current Outpatient Medications:    cariprazine (VRAYLAR) 1.5 MG capsule, Take 1 capsule (1.5 mg total) by mouth daily., Disp: 30 capsule, Rfl: 2   chlorhexidine (HIBICLENS) 4 % external liquid, Apply a thin coat to entire body from neck down and wash off in shower 45 minutes later.  Repeat in 4 days., Disp: 236 mL, Rfl: 1   clindamycin (CLEOCIN) 300 MG capsule, Take 1 capsule (300 mg total) by mouth 3 (three) times daily., Disp: 21 capsule, Rfl: 0   mupirocin ointment (BACTROBAN) 2 %, Apply one half a gram to each nare twice daily for 5 days., Disp: 10 g, Rfl: 0   traZODone (DESYREL) 50 MG tablet, Take 1 tablet (50 mg total) by mouth at  bedtime., Disp: 90 tablet, Rfl: 1   Vilazodone HCl 20 MG TABS, Take one tablet by mouth daily. Must take with food., Disp: 90 tablet, Rfl: 1 Medication Side Effects: none  Family Medical/ Social History: Changes? No  MENTAL HEALTH EXAM:  There were no vitals taken for this visit.There is no height or weight on file to calculate BMI.  General Appearance: Casual, Neat, and Well Groomed  Eye Contact:  Good  Speech:  Clear and Coherent  Volume:  Normal  Mood:  NA  Affect:  Appropriate  Thought Process:  Coherent  Orientation:  Full (Time, Place, and Person)  Thought Content: Logical   Suicidal Thoughts:  No  Homicidal Thoughts:  No  Memory:  WNL  Judgement:  Good  Insight:  Good  Psychomotor Activity:  Normal  Concentration:  Concentration: Good  Recall:  Good  Fund of Knowledge: Good  Language: Good  Assets:  Desire for Improvement  ADL's:  Intact  Cognition: WNL  Prognosis:  Good    DIAGNOSES:    ICD-10-CM   1. Generalized anxiety disorder  F41.1 Vilazodone HCl 20 MG TABS    cariprazine (VRAYLAR) 1.5 MG capsule    2. Major depressive disorder, recurrent episode, moderate (HCC)  F33.1 Vilazodone HCl 20 MG TABS    3. Severe episode of recurrent major depressive disorder, without psychotic features (HCC)  F33.2 cariprazine (VRAYLAR) 1.5 MG capsule    4. Unspecified mood (affective) disorder (HCC)  F39 cariprazine (VRAYLAR)  1.5 MG capsule      Receiving Psychotherapy: No    RECOMMENDATIONS:   Greater than 50% of face to face time with patient was spent on counseling and coordination of care. We discussed his recent moderate improvement with severe depression and mood fluctuations since last visit. Mother is also seeing improvement. We discussed him increasing dose  of Vraylar to every day now.  Not prepared for diagnosis of mood disorder but has moderated on AP.     We agreed to: To continue  Vraylar 1.5 mg in the am after breakfast.Will ramp up to everyday  dosing. Continue STOP of  Vyvanse 40 mg chewable tablet promptly Continue Viibryd 20 mg daily.  Must take with food. To continue trazodone 50 mg at bedtime for sleep Will follow-up in 8 weeks to reassess Provided emergency contact information Will report side effects or worsening symptoms Discussed potential benefits, risks, and side effects of stimulants with patient to include increased heart rate, palpitations, insomnia, increased anxiety, increased irritability, or decreased appetite.  Instructed patient to contact office if experiencing any significant tolerability issues.  Discussed potential metabolic side effects associated with atypical antipsychotics, as well as potential risk for movement side effects. Advised pt to contact office if movement side effects occur.   Reviewed PDM   Joan Flores, NP

## 2023-04-09 ENCOUNTER — Ambulatory Visit: Payer: BC Managed Care – PPO | Admitting: Psychology

## 2023-04-09 ENCOUNTER — Encounter: Payer: Self-pay | Admitting: Psychology

## 2023-04-09 DIAGNOSIS — F39 Unspecified mood [affective] disorder: Secondary | ICD-10-CM | POA: Diagnosis not present

## 2023-04-09 DIAGNOSIS — F909 Attention-deficit hyperactivity disorder, unspecified type: Secondary | ICD-10-CM

## 2023-04-09 NOTE — Progress Notes (Signed)
   Bryson Dames, PhD

## 2023-04-09 NOTE — Progress Notes (Signed)
 [name] Counselor Initial Adult Exam  Name: Edward Yu Date: 04/09/2023 MRN: [id number] DOB: 04-25-1990 PCP: Mliss Sax, MD  Time spent: 12:30 - 1:30 pm  Guardian/Informant:  Harlan Stains - Patient    Paperwork requested: No  Met with patient for initial interview.  Patient was at home and session was conducted from therapist's office via video conferencing.  Patient expressed awareness of the limitations related to video sessions and verbally consented to telehealth.    Reason for Visit /Presenting Problem: Self referred for ASD testing.  Patient reported always feeling like he is different from others.  Wants to be social but doesn't know how to do so.  Typically tries to separate self from others.  Been previously diagnosed with bipolar depression and ADHD.  Would swing arm wildly when he ran.  Very precise with time and would get highly anxious if plans did not occur at time.  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 Often loses focus when listening, getting caught up on own thoughts or images.  Has trouble relating to most people.  Suspects having ASD.     Mental Status Exam: Appearance:   Casual and Fairly Groomed     Behavior:  Appropriate and Sharing  Motor:  Normal  Speech/Language:   Clear and Coherent and Normal Rate  Affect:  Appropriate  Mood:  euthymic  Thought process:  normal  Thought content:    WNL  Sensory/Perceptual disturbances:    WNL  Orientation:  oriented to person, place, time/date, and situation  Attention:  Good  Concentration:  Good  Memory:  WNL  Fund of knowledge:   Good  Insight:    Good  Judgment:   Good  Impulse Control:  Good   Developmental History: Early delays - Early milestones unknown.  Always struggled in school but socially moved along.  Hard to stay focused on topics of low interest (anything other than math or science).   Motor -  Good motor coordination - did skateboarding and wakeboarding during teens but always stiff with his posture sitting or standing.  Can draw somewhat adequate.  Handwriting is legible but not neat.  Adequate with fasteners and shoe tying.  Has to tie boots several times throughout the day due them becoming loose.  Fine motor skills inconsistent.  Speech - Understood by others in general but struggles with social-emotional communication.  A coworker stated that patient talked to him"like a 20 year old." Patient stated that he speaks very directly.    Self Care - Hygiene is inconsistent, influenced by his depression and ADHD.  Will go through his routines without thinking about them consciously.   Independent - Has a job and drives.  Poor with directions so relies on a GPS for navigation.  Pays bills, mostly on time.    Social - Struggles in group settings, even if familiar with them - will shut down and not say anything.  Even in 1:1 conversations will run out of topics to talk about.  More focused on what he going to say than listening to the other person. No close relations outside of his partner (male) and two children.   Reported Symptoms:  Trouble falling and staying asleep.  Takes trazodone to help with this.  Appetite increases when depressed and bored. Energy is depleted during the day.  Drinks 2 energy drinks per day.  Used to have as many as 5 energy drinks. Periods  of prolonged sadness and depressed mood.  Currently taking medication Vraylar for this.  Can be cyclical but also triggered by having many events (typical or unexpected) happening within a short amount of time.  Periods low self esteem and having no value but no hopelessness.  Periods of mania also reported.  Either very sad or excessively happy/excited.  Currently works two jobs and used to walk 8 miles daily.  Had a panic attack at work which started his interest in learning about his mental health.  Unsure of triggers. Some general but  mostly worries about being a good person, being "normal" in crowds and public settings.  Feels like he doesn't fit into normal society.  Has much social anxiety.  No intrusive thought.  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  Overly precise and perfectionist.  Adequate attention but has moments when loses focus.  Listens better when not directly looking at people.  Easily distracted, frequent losing (wallet & keys) and forgetting.  Poor organization (works in piles).  Not restless/fidgety but taps fingers frequently.  Frequent interrupting.  Will act impulsively with starting activities but not finishing. Voice volume increases as he continues in the conversation.  Trouble relating to most people his age.  Trouble understanding jokes or sarcasm.  Patient is typically serious and direct most of the time so it is difficult for others to tell when he is joking.  No close relations outside of partner and family.  Some repetitive [phrases says at work. Can get overly involved in his interests (welding, guitar) that he can do these four hours without being aware of time.  Usually becomes fearful anticipating change but can typically handle change once it occurs.  Overly sensitive to noise at work.             Risk Assessment: Danger to Self:  No Self-injurious Behavior: No Danger to Others: No Duty to Warn:no Physical Aggression / Violence:No  Access to Firearms a concern: No  Gang Involvement:No  Patient / guardian was educated about steps to take if suicide or homicide risk level increases between visits: n/a While future psychiatric events cannot be accurately predicted, the patient does not currently require acute inpatient psychiatric care and does not currently meet [facility] involuntary commitment criteria.  Substance Abuse History: Current substance abuse: No  recreational drug or alcohol use  Past Psychiatric History:    Previous psychological history is significant for ADHD, depression, and bipolar II Outpatient Providers:No counseling/therapy but sees a provider at [facility] for medication management.   History of Psych Hospitalization: No  Psychological Testing: Attention/ADHD:  Tested for ADHD [facility] during November 2024.  Results were positive for ADHD with suspicion of ASD.     Abuse History:  Victim of: No.,  Report needed: No. Victim of Neglect:No. Perpetrator of  None   Witness / Exposure to Domestic Violence: No   Protective Services Involvement: No  Witness to [employer] Violence:  No   Family History:  Family History  Problem Relation Age of Onset   Arthritis Mother    Anxiety disorder Father    Hernia Father    Bipolar disorder Sister    Hypertension Maternal Grandmother    Heart disease Maternal Grandmother    Thyroid disease Maternal Grandmother    Depression Paternal Grandmother    Kidney disease Paternal Grandmother    Juvenile Rhematoid Arthritis Paternal Grandmother   Autism Spectrum  Living  situation: The patient lives with an adult companion and their two children. Good relations overall.  Has two sisters (one with mental health issues).  Talks to both sisters but more with older sister.  Good relations with parent (lives close to them).    Sexual Orientation: Straight  Relationship Status: co-habitating  Name of spouse / other:Tricia - been together for 10 years.  Considering getting engaged and eventually married. If a parent, number of children / ages:Two girls (Mackenzie and Management consultant) ages 8 & 6.  Both will be tested for ASD.    Support Systems: significant other and parents  Financial Stress:  Yes   Still having difficulty with finances even with 2nd job.    Income/Employment/Disability: Employment - works at [employer] (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  Performs well on  jobs overall. Struggles with sales and social situation but mostly perform maintenance and hands on activity.      Military Service: No   Educational History: Education: some college Attended an Technical sales engineer for [employer], but couldn't complete the first year due to finances.  Graduated HS but only paid attention and worked hard in subjects of interest (math and science).    Recreation/Hobbies: Publishing copy, Industrial/product designer work, spending time with partner and children.    Stressors: Occupational concerns   but starting to calm recently.    Strengths: Music, computer games, solitary activities.  Talks better to machines than people.      Barriers: lack of social skills    Legal History: Pending legal issue / charges: The patient has no significant history of legal issues.  Medical History/Surgical History: reviewed Past Medical History:  Diagnosis Date   Anxiety    Depression   MRSA, cyst in ear, sag in cornea of left eye.    Past Surgical History:  Procedure Laterality Date   KNEE SURGERY Right     Medications: Current Outpatient Medications  Medication Sig Dispense Refill   cariprazine (VRAYLAR) 1.5 MG capsule Take 1 capsule (1.5 mg total) by mouth daily. 30 capsule 2   chlorhexidine (HIBICLENS) 4 % external liquid Apply a thin coat to entire body from neck down and wash off in shower 45 minutes later.  Repeat in 4 days. 236 mL 1   clindamycin (CLEOCIN) 300 MG capsule Take 1 capsule (300 mg total) by mouth 3 (three) times daily. 21 capsule 0   mupirocin ointment (BACTROBAN) 2 % Apply one half a gram to each nare twice daily for 5 days. 10 g 0   traZODone (DESYREL) 50 MG tablet Take 1 tablet (50 mg total) by mouth at bedtime. 90 tablet 1   Vilazodone HCl 20 MG TABS Take one tablet by mouth daily. Must take with food. 90 tablet 1   No current facility-administered medications for this visit.  Currently taking Vraylar, Trazodone, and Vilazodone.  Was taking  stimulant medication for ADHD but that was stopped once he started taking Vraylar for Bipolar.    No Known Allergies Never tested for allergies but believes he is allergic to short dog hair.  No digestive problems.  No concussions, seizures, or head injuries.    Diagnoses:  Attention deficit hyperactivity disorder (ADHD), unspecified ADHD type  Unspecified mood (affective) disorder (HCC)  Plan of Care: Patient presents with a history of behavior and emotion regulation problems which have yielded previous diagnoses of ADHD and bipolar depression, along with medications for these conditions.  Patient was seeking psychological testing related to  difficulty with social interaction, verbal and nonverbal communication, overly intense interests, anticipatory fear of change, and hypersensitivity to noise.  Testing recommended to evaluate for Autism spectrum disorder along with other conditions that maybe affecting social interaction and behavior.   Test Battery K-BIT 2R, CNSVS, BRIEF-2A, CAARS-2, PAI, ADOS 2 Module 4, SRS-2 (S & O).   Bryson Dames, PhD

## 2023-04-14 ENCOUNTER — Encounter: Payer: Self-pay | Admitting: Psychology

## 2023-04-14 ENCOUNTER — Ambulatory Visit: Payer: BC Managed Care – PPO | Admitting: Psychology

## 2023-04-14 DIAGNOSIS — F39 Unspecified mood [affective] disorder: Secondary | ICD-10-CM | POA: Diagnosis not present

## 2023-04-14 DIAGNOSIS — F909 Attention-deficit hyperactivity disorder, unspecified type: Secondary | ICD-10-CM

## 2023-04-14 NOTE — Progress Notes (Signed)
   Bryson Dames, PhD

## 2023-04-14 NOTE — Progress Notes (Signed)
 [facility] Counselor/Therapist Progress Note  Patient ID: Edward Yu, MRN: [id number],    Date: 04/14/2023  Time Spent: 12:30 - 3:30pm   Treatment Type: Testing  Met with patient for testing session.  Patient was at the clinic and session was conducted from therapist's office in person.    Reported Symptoms/Reason for Referral: Patient presents with a history of behavior and emotion regulation problems which have yielded previous diagnoses of ADHD and bipolar depression, along with medications for these conditions. Patient was seeking psychological testing related to difficulty with social interaction, verbal and nonverbal communication, overly intense interests, anticipatory fear of change, and hypersensitivity to noise. Testing recommended to evaluate for Autism spectrum disorder along with other conditions that maybe affecting social interaction and behavior.   Mental Status Exam: Appearance:  Casual and Fairly Groomed     Behavior: Appropriate  Motor: Normal  Speech/Language:  Clear and Coherent and Normal Rate  Affect: Appropriate  Mood: euthymic  Thought process: normal  Thought content:   WNL  Sensory/Perceptual disturbances:   WNL  Orientation: oriented to person, place, time/date, and situation  Attention: Fair  Concentration: Fair  Memory: WNL  Fund of knowledge:  Fair  Insight:   Fair  Judgment:  Good  Impulse Control: Good   Risk Assessment: Danger to Self:  No Self-injurious Behavior: No Danger to Others: No Duty to Warn:no Physical Aggression / Violence:No   Subjective: Testing included the K-BIT 2R (0.75 hrs. for testing and scoring) along with the CNS Vital signs (0.75 hrs.), BRIEF-2 (0.5 hrs.), CAARS-2 (0.5 hrs.), and PAI (0.5 hrs.).    Patient was cooperative and displayed good effort. Attention and concentration were adequate overall, although patient exhibited several instances of self-correction, asking questions to be repeated, and  missing relatively  easy problems or questions.  Performance was inconsistent in general, even on rating rating forms, when he needed much assistance understanding directions and questions.  Mood was euthymic with appropriate affect.  The results appear representative of current functioning.    Diagnosis:Attention deficit hyperactivity disorder (ADHD), unspecified ADHD type  Unspecified mood (affective) disorder (HCC)  Plan: Testing to continue next session with the ADOS 2 Module 4 and SRS-2 (S & O) followed by report writing and interactive feedback.     Bryson Dames, PhD

## 2023-04-21 ENCOUNTER — Ambulatory Visit: Payer: BC Managed Care – PPO | Admitting: Psychology

## 2023-04-21 ENCOUNTER — Encounter: Payer: Self-pay | Admitting: Psychology

## 2023-04-21 DIAGNOSIS — F39 Unspecified mood [affective] disorder: Secondary | ICD-10-CM

## 2023-04-21 DIAGNOSIS — F909 Attention-deficit hyperactivity disorder, unspecified type: Secondary | ICD-10-CM | POA: Diagnosis not present

## 2023-04-21 NOTE — Progress Notes (Signed)
 [facility] Counselor/Therapist Progress Note  Patient ID: Edward Yu, MRN: [id number],    Date: 04/21/2023  Time Spent: 12:30 - 2:30pm   Treatment Type: Testing  Met with patient for testing session.  Patient was at the clinic and session was conducted from therapist's office in person.    Reported Symptoms/Reason for Referral: Patient presents with a history of behavior and emotion regulation problems which have yielded previous diagnoses of ADHD and bipolar depression, along with medications for these conditions. Patient was seeking psychological testing related to difficulty with social interaction, verbal and nonverbal communication, overly intense interests, anticipatory fear of change, and hypersensitivity to noise. Testing recommended to evaluate for Autism spectrum disorder along with other conditions that maybe affecting social interaction and behavior.   Mental Status Exam: Appearance:  Casual and Fairly Groomed     Behavior: Appropriate  Motor: Normal  Speech/Language:  Clear and Coherent and Normal Rate  Affect: Appropriate  Mood: euthymic  Thought process: normal  Thought content:   WNL  Sensory/Perceptual disturbances:   WNL  Orientation: oriented to person, place, time/date, and situation  Attention: Fair  Concentration: Fair  Memory: WNL  Fund of knowledge:  Fair  Insight:   Fair  Judgment:  Good  Impulse Control: Good   Risk Assessment: Danger to Self:  No Self-injurious Behavior: No Danger to Others: No Duty to Warn:no Physical Aggression / Violence:No   Subjective: Testing included the ADOS 2 Module 4 (1.75 hrs. for testing and scoring) along with the SRS-2 - Self report (0.25 hrs.), The SRS-2 - Other report was sent to mother to complete online.     Patient was cooperative and displayed good effort. Attention and concentration were adequate overall, although patient exhibited several instances of self-correction, asking questions to be  repeated, and missing relatively  easy problems or questions.  Performance was inconsistent in general, even on rating rating forms, when he needed much assistance understanding directions and questions.  Mood was euthymic with appropriate affect.  Patient was very talkative but did not initiate social interaction.  He was responsive to social advances but only with persona anecdotes and comments.  The results appear representative of current functioning.    Total time of testing - 5 hrs. 04/14/23 - 3 hrs. 04/21/23 - 2 hrs.   Diagnosis:Attention deficit hyperactivity disorder (ADHD), unspecified ADHD type  Unspecified mood (affective) disorder (HCC)  Plan: Testing to continue next session with the  and  followed by report writing and interactive feedback.     Iyesha Such, PhD

## 2023-04-27 ENCOUNTER — Ambulatory Visit (INDEPENDENT_AMBULATORY_CARE_PROVIDER_SITE_OTHER): Payer: BC Managed Care – PPO | Admitting: Behavioral Health

## 2023-04-27 ENCOUNTER — Encounter: Payer: Self-pay | Admitting: Psychology

## 2023-04-27 NOTE — Progress Notes (Signed)
 Edward Yu is a 33 y.o. male patient Report writing competed ( 3 hrs.).  Interactive feedback to be conducted next session. Report to be attached to the feedback progress note.  Patient/Guardian was advised Release of Information must be obtained prior to any record release in order to collaborate their care with an outside provider. Patient/Guardian was advised if they have not already done so to contact the registration department to sign all necessary forms in order for us  to release information regarding their care.   Consent: Patient/Guardian gives verbal consent for treatment and assignment of benefits for services provided during this visit. Patient/Guardian expressed understanding and agreed to proceed.    [name], PhD

## 2023-04-30 ENCOUNTER — Encounter: Payer: Self-pay | Admitting: Psychology

## 2023-04-30 ENCOUNTER — Ambulatory Visit: Payer: BC Managed Care – PPO | Admitting: Psychology

## 2023-04-30 DIAGNOSIS — F840 Autistic disorder: Secondary | ICD-10-CM

## 2023-04-30 DIAGNOSIS — F418 Other specified anxiety disorders: Secondary | ICD-10-CM | POA: Diagnosis not present

## 2023-04-30 NOTE — Progress Notes (Signed)
 [facility] Counselor/Therapist Progress Note  Patient ID: Edward Yu, MRN: [id number],    Date: 04/30/2023  Time Spent: 12:30 - 1:10 pm   Treatment Type: Testing - Feedback Session  Met with patient to review results of testing.  Patient was at home and session was conducted from therapist's office via video conferencing.  Patient understood the limitations of video appointments and verbally consented to telehealth.       Reported Symptoms: Patient presents with a history of behavior and emotion regulation problems which have yielded previous diagnoses of ADHD and bipolar depression, along with medications for these conditions. Patient was seeking psychological testing related to difficulty with social interaction, verbal and nonverbal communication, overly intense interests, anticipatory fear of change, and hypersensitivity to noise. Testing recommended to evaluate for Autism spectrum disorder along with other conditions that maybe affecting social interaction and behavior.   Subjective: Interactive feedback was conducted (1 hr.).  It was discussed how patient met the criterion for Autism Spectrum Disorder but not ADHD along with how these conditions affect his ability to complete tasks and relate to others.  Recommendations included discussing results with PCP, developing a visual organization system, and seeking individual counseling along with a support group and work accommodations.  Patient expressed agreement with the results and recommendations.     Total Time of Virtual Evaluation Services:  4 hrs.  04/30/23 Interactive Feedback:1 hr. 04/27/23 Report Writing: 3 hrs.   Diagnosis:Autism spectrum disorder - Level 1  Other Specified Anxiety and Mood Regulation Disorder - History of panic attacks and cyclical depression currently controlled through medication.  Plan: Report to be sent to parent and referring provider.     Jennet Scroggin, PhD

## 2023-04-30 NOTE — Progress Notes (Signed)
 Psychological Testing Report - Confidential  Identifying Information:              Patient's Name:   Edward Yu  Date of Birth:              06/01/1990     Age:                33 years  MRN#:                                   [id number]      Dates of Assessment:  April 8 & 15, 2025         Purpose of Evaluation:  The purpose of the evaluation is to provide diagnostic information and treatment recommendations.  Edward Yu was the primary informant for the evaluation.     Referral Information: Edward Yu was a 33 year old Caucasian male.  He was self-referred to [facility] for psychological testing related to social interaction difficulty and atypical behavior.  Edward Yu reported always feeling like he was different from others.  "I want to be social but don't know how to do so."  Edward Yu has trouble relating to most people and typically tries to separate himself from others.  He has been previously diagnosed with Bipolar Depression and Attention Deficit Hyperactivity Disorder (ADHD).  Edward Yu stated that he would swing his arm wildly when he ran.  He was very precise with time and would get highly anxious if plans did not occur on time.  During work, he would become highly upset if the workflow deviated from his routine/expectations.  He often loses focus when listening, getting distracted by his own thoughts or images.  Autism Spectrum Disorder (ASD) was suspected.    Relevant Background Information:  Edward Yu indicated delayed development through childhood.  While early milestones were unknown, he always struggled in academically school as well as socially.  He was never held back a grade despite his struggles.  It was hard for him to stay focused on topics of low interest (anything other than math or science).  Regarding current development, Edward Yu indicated having good gross motor coordination.  He participated in skateboarding and wakeboarding during his  teen years but was always stiff with his posture sitting or standing.  Fine motor skills were reported to be inconsistent.  His handwriting is legible but not neat.  He can adequately fastener buttons and zippers but must tie his boots several times throughout the day due them becoming loose.  Edward Yu stated that he can be understood by others in general but struggles with social-emotional communication.  Edward Yu stated that he speaks very directly, and a coworker stated that Edward Yu talked to him "like a 44-year-old." Self-care skills were reported to be adequately developed for dressing, although hygiene maintenance is inconsistent, influenced by his depression and ADHD.  He will go through his self-care routines without thinking about them consciously.  Regarding independent skills, Edward Yu has a job, drives and pays his bills mostly on time.  He is poor with directions, so he relies on a GPS for navigation.  Socially, Edward Yu struggles in group settings, even when familiar with those in the group.  He will shut down and not say anything even in individual conversations, as he often cannot think of what to say.  He then becomes more focused on how  he is going to respond rather than listening to the other person.  He does not have any close relations outside of his partner (male) and two children.                           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  Edward Yu has never been tested for allergies, but he believes he is allergic to short dog hair.  Digestion problems, concussions, seizures or head injuries were denied.  Current medications include Vraylar , Trazodone , and Vilazodone .  He was taking stimulant medication for ADHD, but that was stopped once he started taking Vraylar  for Bipolar Disorder.  Previous psychological history is significant for ADHD, depression, and Bipolar II Disorder.  He  has not received any counseling/psychotherapy but sees a provider at [facility] for medication Edward.  A history of psychiatric hospitalization was denied.  Edward Yu was Tested for ADHD through [facility] during November 2024.  The results were positive for ADHD with suspicion of ASD.        Educationally, Edward Yu graduated high school with a regular education diploma.  However, he only paid attention and worked hard in subjects of interest Conservator, museum/gallery).  He attended an [employer]ities [employer] school ([employer]) for electronics, but he couldn't complete the first year due to finances.  Edward Yu currently works two jobs, both as a Consulting civil engineer.  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  He reported performing well on the jobs overall.  He struggles with sales and communication but mostly perform maintenance and hands on activity.  Leisure activities playing guitar, Industrial/product designer work, and spending time with partner and children.  Personal strengths include music, computer games, and solitary activities.  Barriers to success are mainly his social skills, stating that he understands machines than people.      Edward Yu currently lives with his adult companion Edward Yu and their two children Edward Yu and Edward Yu) ages 8 & 6.  They have been together for 10 years and are considering getting engaged and eventually married.  Good relations were reported overall.  Good relations with were reported his parents and sisters, although he is closer to his older sister.  A history of trauma or abuse was denied.  Family mental health history was reported to be significant for anxiety, depression, Bipolar disorder, and ASD.  Both will be tested for Autism Spectrum as well.  Current stressors include occupational concerns, although that is starting to calm recently, as the coworker he was having difficulty with is no  longer working there.   Presenting Symptomology:  Mr. Tietje reported that he has trouble falling and staying asleep.  He takes Trazodone  to help with this.  His appetite increases whenever depressed and bored.  Energy was reported to often be depleted during the day.  He typically drinks 2 energy drinks per day, although he used to have as many as 5 energy drinks per day.  Periods of prolonged sadness and depressed mood were reported.  He is currently taking medication (Vraylar ) for this.  The depressed moods can be cyclical but are also triggered by having many events (typical or unexpected) happening within a short amount of time.  Thoughts of low self-worth and having little value were reported while hopelessness was denied.  Periods of Mania were also reported, either feeling very sad or excessively happy/excited.  Mr. Gill reported having a panic attack at work which started his interest in learning about his mental health.  He was unsure of what triggered the attack.  Some general worry was reported, but he mostly worries about being a good person and acting "normal" in crowds or public settings.  He feels like he doesn't fit into normal society and has much social anxiety.  Intrusive thought was denied.  He engaged in excessive counting during his previous job, as he had to track the number of palettes and began counting excessively by 16's.  He reported being overly precise and perfectionist.  Mr. Weissberg reported having adequate attention but having moments when loses focus.  He listens better when not directly looking at people.  He can become easily distracted, frequent lose his wallet and keys, and forget important information.  Poor organization was reported.  He is not restless/fidgety but taps fingers frequently.  He often interrupts others and will act impulsively with starting activities without finishing them.  His voice volume increases as he continues in the conversation.  Mr. Minder  indicated having trouble relating to most people his age.  He has difficulty understanding jokes or sarcasm.  He is typically serious and direct most of the time, so it is difficult for others to tell when he is joking.  He does not have any close relations outside of his partner and family.  He says some phrases repetitively at work.  He gets so involved in his interests (e.g., welding, guitar) that he can do these four hours without being aware of time.  Mr. Houseworth usually becomes fearful anticipating change but can typically handle transitions once they occur.  He is overly sensitive to noise at work.             Procedures Administered: Alberta Almond Brief Intelligence Test - 2R CNS Vital Signs Behavior Rating Inventory for Executive Function - Adult (BRIEF-A) Connors Adult ADHD Rating Scale 2 - Self Report  Personality Assessment Inventory Autism Diagnostic Observation Schedule - Module 4 Social Responsiveness Scale (Adult Self and Other Report)  Behavioral Observations:  Mr. Vigo was cooperative and displayed good effort. Attention and concentration were adequate overall, although Mr. Meneely exhibited several instances of self-correction, asking questions to be repeated, and missing relatively easy problems or questions.  Performance was inconsistent in general, even on rating forms, when he needed much assistance understanding directions and questions.  Mood was euthymic with appropriate affect.  Mr. Puertas was very talkative but did not initiate social interaction.  He was responsive to social advances but only with personal anecdotes and comments.  The results appear representative of current functioning.  Brief mental status indicated typical general orientation and alertness.  Memory was adequately developed, with fair concentration, insight, and knowledge.  Judgement was adequate with typical thought content.  Hallucinations, delusions, and current thoughts of self-harm were denied.  Test  Results and Interpretation:   General Intellectual Functioning: The K-BIT 2 was used to assess Mr. Vanbergen performance across two areas of cognitive ability. When interpreting these scores, it is important to view the results as a snapshot of current intellectual functioning. As measured by the K-BIT 2, Mr. Dillion' Composite IQ score fell within the low average range when compared to same age peers (CIQ = 23).  Mr. Balogh' performance was inconsistent across the Primary Index Scores, as Verbal Comprehension (VCI = 79) was low and less  developed than Perceptual Reasoning (PRI = 96), which as average.  This indicates stronger visual learning ability than language understanding.  On individual subtests, Mr. Lares performed within the low range for verbal knowledge, with low average inferential thinking (riddles).  Visual pattern analysis (matrices) was average.  Mr. Lingelbach appears to struggle with vocabulary and verbal abstract reasoning but has adequate visual/nonverbal comprehension.    Alberta Almond Brief Intelligence Test - 2 Composite Score Summary  Composite Scores  Sum of Raw Scores Standard Score Percentile Rank Confidence Interval Qualitative Description  Verbal Comprehension  VC  71  79  10  75-85  Low  Nonverbal Reasoning PR 36 96 39 90-103 Average  Composite IQ  FSIQ - 85 16 81-90 Low Average     Domain Subtest Name  Total Raw Score Scaled Score Percentile Rank  Verbal Verbal Knowledge VK 38   5  5  Comprehension Riddles Ri 33   7              16    Attention and Processing: The results of the CNS Vital Signs testing indicated low neurocognitive processing ability, at a level below measured intellectual ability (low average).  Regarding areas related to attention problems, simple attention was extremely low while complex attention was very low.  Cognitive flexibility and executive function were low while strength was noted with sustained attention which was average.  These are the  domains most closely associated with attention deficits.  Motor/psychomotor speed was average to low average with very low processing speed and reaction time, indicating well age typical hand eye coordination with very slow responsiveness and thinking speed on computerized measures.  Visual memory was average while verbal memory was very low and working memory was extremely low average.  This indicates much better memory for pictures than words with adequate problem-solving memory.  Recognizing emotional expression was less than typically developed as the Social Acuity domain was below average.  The results suggest that Mr. Wadsworth appears to have poor typical ability attending to simple and complex tasks with slow thinking speed and impaired processing information requiring more organized thinking.  The validity scales indicated a valid profile, although it was noteworthy that Mr. Daniel performed the highest on the most challenging measures that were nonword related.                                                         CNS Vital Signs   Domain Scores Standard Score Percentile Average Low Average Low Very Low  Neurocognition Index (NCI) 71 3   X   Composite Memory 73 4   X   Verbal Memory 64 1    X  Visual Memory 93 32 X     Psychomotor Speed 85 16  X    Reaction Time* 60 1    X  Complex Attention* 65 1    X  Cognitive Flexibility 71 3   X   Processing Speed 68 2    X  Executive Function 72 3   X   Social Acuity 81 10  X    Working Memory 87 19  X    Sustained Attention 93 32 X     Simple Attention 28 1    X  Motor Speed 98 45  X  Executive Function: Mr. Hennon completed the Self-Report Form of the Behavior Rating Inventory of Executive Function, Second Edition-Adult Version (BRIEF2A) on 04/14/2023. There are no missing item responses in the protocol.  Responses were reasonably consistent. Jontue's ratings of himself do not appear overly negative. There were no atypical responses to  infrequently endorsed items. In the context of these validity considerations, ratings of Kahlin's executive function exhibited in everyday behavior indicate some areas of concern.  The overall index score, the GEC, was moderately elevated (GEC T = 73, %ile = 98). The Emotion Regulation Index (ERI) score was within normal limits (ERI T = 58, %ile = 80), but the Behavior Regulation Index (BRI) score was mildly elevated (BRI T = 65, %ile = 95) and the Cognitive Regulation Index (CRI) score was highly elevated (CRI T = 79, %ile = 99).  Within these summary indicators, all the individual scales can be calculated. One or more of the individual BRIEF2A scale T scores were elevated, suggesting that Layden exhibits difficulty with some aspects of executive function. Concerns are noted with his ability to be aware of his functioning in social settings, get going on tasks and activities and independently generate ideas, sustain working memory, plan and organize his approach to problem solving appropriately, be appropriately cautious in his approach to tasks and check for mistakes, and keep materials and belongings reasonably well-organized. Zayde's ability to resist impulses, adjust well to changes, and react to events appropriately was not described as problematic.  The overall profile suggests that Creedence exhibits difficulties with working memory and with planning and organization.  Individuals with similar elevations on the Working Memory scale but without significant elevations on the Inhibit, Shift, and Emotional Control scales are often described as generally inattentive.  Without appropriate working memory, Weaver's ability to maintain focus for appropriate lengths of time may be compromised.  Furthermore, he may have secondary difficulty grasping the gist of new information and developing a plan of approach for future-oriented problem-solving. This profile is often seen in individuals with inattentive-type  attention disorders.  Additionally, Jovonni's scores on the Initiate and Task-Monitor scales are elevated.  This profile suggests significant difficulties with general cognitive problem solving. Kshawn is likely to have significant difficulty with independent problem solving due to problems with his ability to actively hold multiple pieces of information in mind and systematically construct a plan. As a result, this may have a negative impact on his attempts to get started on these tasks. In addition, Talin is likely to have difficulty monitoring his performance during tasks.  BRIEF-2A Score Summary Table Scale/Index/Composite Raw score T score Percentile 90% CI  Inhibit 13 61 86 52-70  Self-Monitor 12 69 98 61-77  Behavior Regulation Index (BRI) 25 65 95 58-72  Shift 11 60 87 52-68  Emotional Control 12 54 75 49-59  Emotion Regulation Index (ERI) 23 58 80 53-63  Initiate 21 80 99 73-87  Working Memory 15 66 94 59-73  Plan/Organize 23 87 >99 80-94  Task-Monitor 13 71 99 63-79  Organization of Materials 19 73 99 67-79  Cognitive Regulation Index (CRI) 91 79 99 75-83  Global Executive Composite (GEC) 139 73 98 70-76                    Behavioral - Emotional Functioning: Ratings of attention and behavioral functioning by Mr. Escher indicated much some overall attention and behavior regulation difficulty, below clinically significant levels.  On the CAARS 2 Self Report, Mr. Veron positively endorsed, as  occurring often or very often, just 4 of 9 items for inattention/poor organization and 2 of 9 items for hyperactivity and poor impulse control.  Endorsement of at least 5 items in either category is considered at-risk for ADHD.  T-scores indicated mildly high inattention/executive dysfunction with appropriately developed, activity level, emotion regulation, impulse control, and self-concept.  CAARS 2 SCALES                                             T Score   90% CI   Percentile    Guideline          Elev. Items    Content Scales  Inattention/ Executive Dysfunction 63 60-66 85th Slightly Elevated 11/30  Hyperactivity 55 50-60 71st Not Elevated 3/13  Impulsivity 55 50-60 74th Not Elevated 1/13  Emotional Dysregulation 49 44-54 51st Not Elevated 2/9  Negative Self-Concept 57 52-62 73rd Not Elevated 0/7    T-score 90% CI Percentile Guideline Symptom Count ?  ADHD Inattentive Symptoms 16 55-63 80th Not Elevated 4/9  ADHD Hyperactive/ Impulsive Symptoms 58 53-63 80th Not Elevated 2/9  Total ADHD Symptoms 59 54-64 82nd Not Elevated n/a   Self-report ratings for other clinical and emotional areas indicated generally typical functioning as all scores on the Personality Assessment Inventory were within normal limits other than mildly high scores for anxiety related disorders (specific fears) and paranoia (hypervigilance and resentment) and a low score for dominance (easily manipulated by others).  Disorganized thought and negative relations were also mildly high with very low verbal aggression.  The results, however, were considered invalid due to an excessive number of inconsistent and infrequently endorsed reposes, suggesting that Mr. Meter may have misunderstood several of the test questions.      Regarding symptoms of ASD, information from the ADOS 2 Module 4 indicated difficulty with several aspects of reciprocal social interaction observed during this administration, with some observance of social communication impairment and restricted repetitive behavior.  The results indicated a moderate likelihood of ASD.  Within the area of communication, Mr. Huskins spoke in complete sentences.  There was appropriate variation in his tone of voice.  There was no observation of echolalia or atypical word use.  He was able to adequately report routine and non-routine events and frequently offered spontaneous personal information.  Mr. Farquharson responded adequately to personal information from  the examiner with related comments but did not ask any socially related questions.  Reciprocal conversation appeared typically developed for his age and cognitive level.  He demonstrated less than typical use of spontaneous descriptive, emphatic, and emotional gestures.  Socially, Mr. Granja exhibited limited eye contact when speaking and listening.  His range of facial expression seemed adequate, although he infrequently directed facial expression to the examiner.  He adequately expressed pleasure interacting during the evaluation.  Mr. Spink exhibited difficulty elaborating on personal emotions but was able spontaneously and accurately identify some emotion in others during pictures or stories.  Mr. Kittelson demonstrated adequate insight into the nature of just one social relationship (partner) but could describe his own role in this relationship.  His awareness of or ability to complete age typical levels of responsibility seemed age typical.  Mr. Brothers did not initiate social interaction.  His responses to social advances from the examiner were frequent but limited to related comments of Mr. Bolle own interest or experiences.  Social  rapport was difficult to maintain as many of Mr. Burkel reciprocal responses seemed appropriate but limited in reciprocity.  Mr. Younge demonstrated creative object use and imaginative story telling.  Regarding behavior, Mr. Elzey did not demonstrate any sensory seeking behavior, odd movement, or self-injury.  Compulsive behavior was observed, as he lined up and arranged markers during a break period.  Also Mr. Jares used overly technical language when discussing his interest in mechanical/electrical topics.  Current functioning regarding ASD related symptoms reported during daily activities was assessed using the Social Responsiveness Scale - 2.  Mr. Haji completed the SRS-2 regarding socialization and behavior.  On this measure, the T Score of 65 was within the mildly  elevated range (60-65).  Scores in this range indicate deficiencies in reciprocal social behavior that are clinically significant and may lead to mild to moderate interference with everyday social interactions but are generally not associated with clinically significant autism spectrum disorders.  Social communication and restricted repetitive behavior were both rated within the mildly elevated range.    Social Responsiveness Scale - 2 - Self Report                                Awr             Cog             Com           Mot            RRB Raw score               10                13                31              16               13                            T-score                    61                60                68              64               62  Awr = Social Awareness    Com = Social Museum/gallery exhibitions officer = Social Cognition    Mot = Social Motivation  RRB = Restricted Interests and Repetitive Behavior  DSM-5 Compatible Subscales Raw score T-score  Social Communication and Interaction         70   65    Restricted Interests and Repetitive Behavior        13   62   Mr. Cambre mother Jantz Main also completed the SRS-2.  On this measure, the T Score of 85 was in the severe range. Scores in this range indicate deficiencies in reciprocal social behavior that are clinically significant and lead to severe and enduring interference with everyday social interactions.  Such scores are strongly associated with clinical diagnosis of an autism spectrum disorder.  The scores for social communication and restricted  repetitive behavior were both rated within the severely elevated range.    Social Responsiveness Scale - 2 - Other Report                                Awr           Cog             Com           Mot            RRB Raw score               15              23                47               27               27                             T-score                    75              77                 83               82               85         Awr = Social Awareness    Com = Social Museum/gallery exhibitions officer = Social Cognition    Mot = Social Motivation  RRB = Restricted Interests and Repetitive Behavior  DSM-5 Compatible Subscales Raw score T-score  Social Communication and Interaction       112   84 Severe   Restricted Interests and Repetitive Behavior        27   85 Severe  Summary:  Mr. Godinho was evaluated during April 2025 related to social interaction difficulty, organizational difficulty and atypical behavior.  Mr. Steinfeldt presents with a history of behavior and emotion regulation problems which have yielded previous diagnoses of ADHD and bipolar depression, along with medications for these conditions. Mr. Groot was seeking psychological testing related to difficulty with social interaction, verbal and nonverbal communication, overly intense interests, anticipatory fear of change, and hypersensitivity to noise. Testing was recommended to evaluate for Autism spectrum disorder along with other conditions that maybe affecting social interaction and behavior. Test results indicated low average overall intelligence (K [id number]), with better developed Nonverbal Reasoning than Verbal Comprehension.  Vocabulary and factual knowledge were especially weak, falling within the low range.  Neurocognitive skills testing indicated poorly developed attention for simple tasks as well as complex activities requiring cognitive flexibility executive function.  However, Mr. Jared scored highest one the most difficult subtest, requiring sustained attention (average) and working memory (low average).  Self-report ratings of executive function indicated highly impaired overall functioning in this area, with clinically significant impairment occurring in self-awareness, initiation, working memory, Engineer, petroleum, task monitoring, and organization of materials.  However, clinically significant ADHD  specific symptoms were denied.  Ratings for emotional functioning were not valid due to highly inconsistent and infrequent responses, suggesting difficulty understanding the test questions.  Testing for  Autism Spectrum Disorder indicated difficulty with several aspects of reciprocal social communication and interaction observed during this administration, with some observance of overly technical speech and intense interests.  Self-report ratings indicated mild social communication deficits and restricted-repetitive behavior while ratings from Mr. Spadaccini mother indicated severe impairment in both areas.  Mr. Wandler behavior patterns, test data and developmental history seem consistent with high functioning Autism Spectrum Disorder (ASD).  Anxiety and emotion regulation difficulty also seem present, although currently controlled through medication.  Furthermore, attention problems appear more related to interest and executive function deficits than ADHD.  Recommendations include discussing results with Primary Care Physician or psychiatrist, developing a visual organization system, seeking individual counseling as well as accessing appropriate social and community support services, as needed.  See below for further recommendations.        Diagnostic Impression: DSM 5  Autism Spectrum Disorder - Level 1 Needs Support Other Specified Anxiety and Mood Regulation Disorder - History of panic attacks and cyclical depression currently controlled through medication.  Recommendations: Recommendations are to discuss results with Primary Care Physician or Psychiatrist.  There is no specific medication for treating Autism Spectrum, although medication can help with associated symptoms.  Current medication regimen is recommended to continue to help with sudden anxiety, mood regulation, and sleep.  Stimulant medication for attention deficits is not recommended.     Individual counseling is recommended to help Mr.  Lanese with developing emotion regulation, stress Edward, organization, and social skills.  Mr. Arizola would benefit from a structured therapy approach that focuses on the teaching of specific skills for managing anxiety, obsessive/overly negative thought, and depression. Cognitive Behavior Therapy and Acceptance and Commitment Therapy are two popular evidence-based treatment approaches that teach these skills.   Additional training in social skills is available on the internet through the [employer] website by [employer].   Mental alertness/energy can also be raised by increasing exercise, improving sleep, eating a healthy diet, and managing depression/stress.  Consult with a physician regarding any changes to physical regimen.  Seek social support through an Autism Spectrum Support group well as accessing Sula End, Ph.D. regarding general information about ASD through either his website or You Tube.  Local support can be garnered through the [facility] in [place].  Support services and training in adult independent skills may also be available through the [employer]ism [employer] of [place] and [facility].    Please contact this provider if further information is needed.     Milon Aloe Ryot Burrous, Ph.D. Licensed Psychologist - [facility] [place] Licensed psychologist [phone]               Beatrix Breece, PhD

## 2023-04-30 NOTE — Progress Notes (Signed)
   Bryson Dames, PhD

## 2023-05-07 ENCOUNTER — Ambulatory Visit (INDEPENDENT_AMBULATORY_CARE_PROVIDER_SITE_OTHER): Payer: Self-pay | Admitting: Behavioral Health

## 2023-05-07 DIAGNOSIS — Z0389 Encounter for observation for other suspected diseases and conditions ruled out: Secondary | ICD-10-CM

## 2023-05-07 NOTE — Progress Notes (Signed)
 Pt did not show for scheduled appt and did not provide 24 hour notice as required. Additional fees to be assessed.

## 2023-05-13 ENCOUNTER — Encounter: Payer: Self-pay | Admitting: Behavioral Health

## 2023-05-13 ENCOUNTER — Ambulatory Visit: Admitting: Behavioral Health

## 2023-05-13 DIAGNOSIS — F411 Generalized anxiety disorder: Secondary | ICD-10-CM | POA: Diagnosis not present

## 2023-05-13 DIAGNOSIS — F39 Unspecified mood [affective] disorder: Secondary | ICD-10-CM

## 2023-05-13 DIAGNOSIS — F332 Major depressive disorder, recurrent severe without psychotic features: Secondary | ICD-10-CM | POA: Diagnosis not present

## 2023-05-13 DIAGNOSIS — F331 Major depressive disorder, recurrent, moderate: Secondary | ICD-10-CM | POA: Diagnosis not present

## 2023-05-13 MED ORDER — CARIPRAZINE HCL 1.5 MG PO CAPS: 1.5000 mg | ORAL_CAPSULE | Freq: Every day | ORAL | 2 refills | Status: AC

## 2023-05-13 MED ORDER — VILAZODONE HCL 20 MG PO TABS: ORAL_TABLET | ORAL | 1 refills | Status: AC

## 2023-05-13 NOTE — Progress Notes (Signed)
 [name]  Patient ID: PHYLLIP Yu,  MRN: [id number]  PCP: Tonna Frederic, MD  Date of Evaluation: 05/13/2023 Time spent:30 minutes  Chief Complaint:  Chief Complaint   Depression; ADHD; Anxiety; Follow-up; Patient Education; Medication Refill     HISTORY/CURRENT STATUS: HPI "Edward Yu", 33 year old male presents to this office for follow up and medication management.  Collateral information should be considered reliable. Does not appear distressed today. Speech is good. He says he feels better and big improvement. Now taking Vraylar  every day. Has to take at night because it makes him sleepy. Patient says that his anxiety today is 2/10, and depression is 2/10.  Says that his sleep has been inconsistent and poor recently only getting about 6-7 hours per night.  He denies any history of mania, no psychosis, no auditory or visual hallucinations.  Denies SI or HI.   No past psychiatric medication trials Vyvanse Viibryd - partial response  Trazodone        Individual Medical History/ Review of Systems: Changes? :No   Allergies: Patient has no known allergies.  Current Medications:  Current Outpatient Medications:    cariprazine  (VRAYLAR ) 1.5 MG capsule, Take 1 capsule (1.5 mg total) by mouth daily., Disp: 30 capsule, Rfl: 2   chlorhexidine  (HIBICLENS ) 4 % external liquid, Apply a thin coat to entire body from neck down and wash off in shower 45 minutes later.  Repeat in 4 days., Disp: 236 mL, Rfl: 1   clindamycin  (CLEOCIN ) 300 MG capsule, Take 1 capsule (300 mg total) by mouth 3 (three) times daily., Disp: 21 capsule, Rfl: 0   mupirocin  ointment (BACTROBAN ) 2 %, Apply one half a gram to each nare twice daily for 5 days., Disp: 10 g, Rfl: 0   traZODone  (DESYREL ) 50 MG tablet, Take 1 tablet (50 mg total) by mouth at bedtime., Disp: 90 tablet, Rfl: 1   Vilazodone  HCl 20 MG TABS, Take one tablet by mouth daily. Must take with food., Disp: 90 tablet, Rfl:  1 Medication Side Effects: none  Family Medical/ Social History: Changes? No  MENTAL HEALTH EXAM:  There were no vitals taken for this visit.There is no height or weight on file to calculate BMI.  General Appearance: Casual  Eye Contact:  Good  Speech:  Clear and Coherent  Volume:  Normal  Mood:  NA  Affect:  Appropriate  Thought Process:  Coherent  Orientation:  Full (Time, Place, and Person)  Thought Content: Logical   Suicidal Thoughts:  No  Homicidal Thoughts:  No  Memory:  WNL  Judgement:  Good  Insight:  Good  Psychomotor Activity:  Normal  Concentration:  Concentration: Good  Recall:  Good  Fund of Knowledge: Good  Language: Good  Assets:  Desire for Improvement  ADL's:  Intact  Cognition: WNL  Prognosis:  Good    DIAGNOSES:    ICD-10-CM   1. Generalized anxiety disorder  F41.1 Vilazodone  HCl 20 MG TABS    cariprazine  (VRAYLAR ) 1.5 MG capsule    2. Major depressive disorder, recurrent episode, moderate (HCC)  F33.1 Vilazodone  HCl 20 MG TABS    3. Severe episode of recurrent major depressive disorder, without psychotic features (HCC)  F33.2 cariprazine  (VRAYLAR ) 1.5 MG capsule    4. Unspecified mood (affective) disorder (HCC)  F39 cariprazine  (VRAYLAR ) 1.5 MG capsule      Receiving Psychotherapy: No    RECOMMENDATIONS:   Greater than 50% of face to face time with patient was spent on counseling and coordination of care.  We discussed his significant improvement this visit. He is much more focused and calm. No psychomotor agitation present today. Pt is requesting to remain on current regimen.   We agreed to:  To continue  Vraylar  1.5 mg in the am after breakfast. Continue Viibryd  20 mg daily.  Must take with food. To continue trazodone  50 mg at bedtime for sleep Will follow-up in 12 weeks to reassess Provided emergency contact information Will report side effects or worsening symptoms Discussed potential benefits, risks, and side effects of stimulants  with patient to include increased heart rate, palpitations, insomnia, increased anxiety, increased irritability, or decreased appetite.  Instructed patient to contact office if experiencing any significant tolerability issues.  Discussed potential metabolic side effects associated with atypical antipsychotics, as well as potential risk for movement side effects. Advised pt to contact office if movement side effects occur.   Reviewed PDM      Lincoln Renshaw, NP

## 2023-08-13 ENCOUNTER — Ambulatory Visit: Admitting: Behavioral Health

## 2023-10-16 DIAGNOSIS — M545 Low back pain, unspecified: Secondary | ICD-10-CM | POA: Diagnosis not present

## 2023-12-23 ENCOUNTER — Ambulatory Visit (HOSPITAL_COMMUNITY)
Admission: EM | Admit: 2023-12-23 | Discharge: 2023-12-23 | Disposition: A | Discharge status: Other Acute Inpatient Hospital | Source: Home / Self Care

## 2023-12-23 DIAGNOSIS — F22 Delusional disorders: Secondary | ICD-10-CM

## 2023-12-23 DIAGNOSIS — F411 Generalized anxiety disorder: Secondary | ICD-10-CM

## 2023-12-23 DIAGNOSIS — F331 Major depressive disorder, recurrent, moderate: Secondary | ICD-10-CM

## 2023-12-23 DIAGNOSIS — F428 Other obsessive-compulsive disorder: Secondary | ICD-10-CM

## 2023-12-23 NOTE — ED Provider Notes (Signed)
 [facility] Medical Screening Exam  Patient Name: Edward Yu MRN: [id number] Date of Evaluation: 12/24/2023 Chief Complaint:  My mind gets obsessed with certain things and I can't let it go.  Diagnosis:  Final diagnoses:  Obsessional thoughts  Generalized anxiety disorder  Delusions (HCC)  Major depressive disorder, recurrent episode, moderate (HCC)   History of Present Illness: Edward Yu is a 33 y.o. male presenting voluntarily, accompanied by his mother Edward Yu) as a walk-in to [facility] for an evaluation of chronic workplace related obsessions and anxiety. Case discussed with Dr. Cole. Past psychiatric hx includes ADHD, autism, GAD, MDD (recurrent episode, moderate), and unspecified mood disorder. He reports that his symptoms began 1-2 years ago after the end of his relationship with his girlfriend and a subsequent ambiguous interaction with a male coworker. He shares that 1-2 years ago there was a lady at work armed forces operational officer with him, so he flirted back but then she told him that she had a boyfriend. Patient reports still he wasn't sure if she was interested in him, so he asked her directly and since then he reports coworkers saying things like he harasses people. The patient has experienced recurrent, intrusive thoughts about being targeted and mistreated by co-workers. He endorses that his coworkers have been public relations account executive and picking on him. He reports that his coworkers think I'm a horrible person and that he talks to others like they're stupid, and thinks he's better then everyone because he got a fast promotion. He reports that his mind is constantly running at work and recently got worse. This has caused him increased anxiety and preoccupation with fears of losing his job. He endorses poor sleep due to racing thoughts and his mother shares that she thinks he has been sleeping a lot during the day. He has expressed a strong desire to leave his job and start a new  one and mother reports that he has sought assistance from her with his resume. He continues to attend work despite marked distress and social isolation. 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  The patient denies suicidal and homicidal ideations. He denies recent and past self-injurious and non-suicidal self-injurious behaviors. Denies any hx of suicide attempts. Denies auditory and visual hallucinations. Doesn't appear to be responding to internal or external stimuli. Mother reports that he was treated previously a year ago for bipolar, but not diagnosed with it. He denies symptoms of mania besides racing thoughts and sleep disturbances. Denies distractibility, elevated self-esteem/grandiosity, increased activity or risky behaviors. The patient recently restarted his previous psychiatric medications prescribed by Edward Pizza, NP with [facility] who he last saw 05/2023. This regimen includes trazodone  50 mg by mouth at bedtime for sleep, vrylar 1.5 mg by mouth daily for mood stabilization, and vilazodone  20 mg by mouth for MDD/GAD. He reports taking it consistently for the past 3 days. He is not currently seeing a psychiatrist or therapist. The patient denies substance use including drugs and alcohol. Reports no access to firearms and weapons. He resides with his girlfriend and 2 children (ages 50 and 27). Coping strategies include playing the guitar, spending time with his gf (Edward Yu), watching movies, and playing games.   He was assessed separately from his mother. Mother corroborated the recent worsening of obsessive symptoms, highlighted increased anxiety about being targeted by coworkers for the past 2 weeks. His mother reports that he is a toe walker. She described that recently, her son called her because he believed someone was pretending to  fire a coworker to upset him; since the patient's father also works at the same place, she contacted him and learned that the coworker who got fired was likely due to a  batch mix up. The mother reports that her son has been paranoid and has been pre-occupied with the thought that someone is trying to get him fired and has a fear of losing his job. Discussed that his anxiety could also be connected to autism because he has mentioned to her that he is afraid someone will catch him in the locker room changing due to his stinky feet. She mentioned that last night she received a text from him while he was at work saying I got to go, which prompted her to call and text him, but he did not answer initially. She later learned this was related to his desire to leave his job. Patient also reports that it was in the context of his need to leave his current job and go somewhere else to work due to increasing levels of anxiety. Denies ever having suicidal thoughts. She reports that he had bipolar like symptoms a year ago and was started on medication at that time. Chart review also shows a similar pattern of symptoms back in January of 2025 while he was seeing Edward Pizza, NP but patient became noncompliant with his medication after he stopped seeing him in May of 2025.   Psychoeducation was provided regarding medication adherence, the need to re-establish psychiatric care, and the overlap of mood, ADHD, and autism spectrum symptoms as they can share features such as difficulties with emotional regulation, anxiety, social challenges, and intrusive thoughts, making it important to differentiate the source of symptoms for targeted treatment. There may be some social anxiety linked to his autism.This would require a comprehensive psychiatric evaluation by a mental health provider. Discussed with the patient and mother that patient is not at an imminent risk of harm to himself or others. Recommendation to follow-up with resources provided for re-establishing care with psychiatric services (medication management/counseling) and option to attend a partial-hospitalization program. His mother  plans on getting him assessed by a psychologist soon to confirm autism diagnosis. Discussed that once a comprehensive psychiatric evaluation is completed, his provider can rule out if he is experiencing true delusions and paranoia.   Based on my evaluation the patient does not appear to have an emergency medical condition and can be discharged with resources and follow up care in outpatient services for Medication Management and counseling services.   Patient does not meet inpatient psychiatric admission criteria or IVC criteria at this time.  There is no evidence of imminent risk of harm to self or others.   Discussed crisis plan, calling 911, going to the ED if condition changes or worsens. Patient verbalizes understanding.   Flowsheet Row ED from 12/23/2023 in [facility]  C-SSRS RISK CATEGORY No Risk    Psychiatric Specialty Exam  Presentation  General Appearance:Appropriate for Environment; Casual  Eye Contact:Fair  Speech:Clear and Coherent  Speech Volume:Normal  Handedness:Right   Mood and Affect  Mood: Anxious; Depressed  Affect: Appropriate; Congruent   Thought Process  Thought Processes: Coherent  Descriptions of Associations:Intact  Orientation:Full (Time, Place and Person)  Thought Content:Obsessions; Delusions  Diagnosis of Schizophrenia or Schizoaffective disorder in past: No   Hallucinations:None  Ideas of Reference:None  Suicidal Thoughts:No  Homicidal Thoughts:No   Sensorium  Memory: Immediate Good  Judgment: Fair  Insight: Fair   Executive Functions  Concentration:  Fair  Attention Span: Fair  Recall: [employer] of Knowledge: Fair  Language: Fair   Psychomotor Activity  Psychomotor Activity: Normal   Assets  Assets: Manufacturing Systems Engineer; Desire for Improvement; Financial Resources/Insurance; Housing; Physical Health; Social Support; Resilience   Sleep  Sleep: Fair  Number of  hours: No data recorded  Physical Exam: Physical Exam Vitals and nursing note reviewed.  Constitutional:      Appearance: Normal appearance.  HENT:     Head: Normocephalic.     Nose: Nose normal.  Cardiovascular:     Rate and Rhythm: Normal rate.  Pulmonary:     Effort: Pulmonary effort is normal.  Musculoskeletal:        General: Normal range of motion.     Cervical back: Normal range of motion.  Neurological:     Mental Status: He is alert and oriented to person, place, and time.  Psychiatric:        Attention and Perception: Attention normal. He does not perceive auditory or visual hallucinations.        Mood and Affect: Mood is anxious and depressed.        Speech: Speech normal.        Behavior: Behavior normal. Behavior is cooperative.        Thought Content: Thought content does not include homicidal or suicidal ideation. Thought content does not include homicidal or suicidal plan.        Judgment: Judgment normal.    Review of Systems  Constitutional: Negative.   HENT: Negative.    Eyes: Negative.   Respiratory: Negative.    Cardiovascular: Negative.   Gastrointestinal: Negative.   Genitourinary: Negative.   Musculoskeletal: Negative.   Skin: Negative.   Neurological: Negative.   Endo/Heme/Allergies: Negative.   Psychiatric/Behavioral:  Positive for depression. Negative for hallucinations, memory loss, substance abuse and suicidal ideas. The patient is nervous/anxious and has insomnia.    Blood pressure (!) 137/97, pulse 62, temperature 98.4 F (36.9 C), temperature source Oral, resp. rate 18, SpO2 99%. There is no height or weight on file to calculate BMI.  Musculoskeletal: Strength & Muscle Tone: within normal limits Gait & Station: normal Patient leans: N/A   [facility] MSE Discharge Disposition for Follow up and Recommendations: Based on my evaluation the patient does not appear to have an emergency medical condition and can be discharged with resources and  follow up care in outpatient services for Medication Management, Partial Hospitalization Program, Individual Therapy, and Group Therapy  Discharge recommendations:  You are scheduled for an assessment for the Partial Hospitalization Program on Tuesday, 12/23 at 10:00 am. PHP is group therapy that meets Mon-Thur from 9am-2pm and Friday from 9am-1pm and lasts for approximately 2-3 weeks. This assessment appointment will last approximately 1-1.5 hours and will be virtual via MyChart. If you need to cancel or reschedule, please call [phone].  Medications: Patient is to take medications as prescribed. The patient or patient's guardian is to contact a medical professional and/or outpatient provider to address any new side effects that develop. The patient or the patient's guardian should update outpatient providers of any new medications and/or medication changes.   Outpatient Follow up: Please review list of outpatient resources for psychiatry and counseling. Please follow up with your primary care provider for all medical related needs.   Therapy: We recommend that patient participate in individual therapy to address mental health concerns.  Atypical antipsychotics: If you are prescribed an atypical antipsychotic, it is recommended that your height, weight,  BMI, blood pressure, fasting lipid panel, and fasting blood sugar be monitored by your outpatient providers.  Safety:   The following safety precautions should be taken:   No sharp objects. This includes scissors, razors, scrapers, and putty knives.   Chemicals should be removed and locked up.   Medications should be removed and locked up.   Weapons should be removed and locked up. This includes firearms, knives and instruments that can be used to cause injury.   The patient should abstain from use of illicit substances/drugs and abuse of any medications.  If symptoms worsen or do not continue to improve or if the patient becomes  actively suicidal or homicidal then it is recommended that the patient return to the closest hospital emergency department, the [facility], or call 911 for further evaluation and treatment. National Suicide Prevention Lifeline 1-800-SUICIDE or 239-852-3619.  About 988 988 offers 24/7 access to trained crisis counselors who can help people experiencing mental health-related distress. People can call or text 988 or chat 988lifeline.org for themselves or if they are worried about a loved one who may need crisis support.   You are encouraged to follow up with [facility] for outpatient treatment.  [facility] [address] 2nd floor [place], [place] [phone] Partial hospitalization program: Monday through Thursday 9 am-2 pm and Friday 9 am-1pm. Provides medication management and counseling services as well.   Dorothia Passmore, NP 12/24/2023, 2:45 PM

## 2023-12-23 NOTE — Discharge Instructions (Addendum)
 Discharge recommendations:   You are scheduled for an assessment for the Partial Hospitalization Program on Tuesday, 12/23 at 10:00 am. PHP is group therapy that meets Mon-Thur from 9am-2pm and Friday from 9am-1pm and lasts for approximately 2-3 weeks. This assessment appointment will last approximately 1-1.5 hours and will be virtual via MyChart. If you need to cancel or reschedule, please call [phone]   Medications: Patient is to take medications as prescribed. The patient or patient's guardian is to contact a medical professional and/or outpatient provider to address any new side effects that develop. The patient or the patient's guardian should update outpatient providers of any new medications and/or medication changes.   Outpatient Follow up: Please review list of outpatient resources for psychiatry and counseling. Please follow up with your primary care provider for all medical related needs.   Therapy: We recommend that patient participate in individual therapy to address mental health concerns.  Atypical antipsychotics: If you are prescribed an atypical antipsychotic, it is recommended that your height, weight, BMI, blood pressure, fasting lipid panel, and fasting blood sugar be monitored by your outpatient providers.  Safety:   The following safety precautions should be taken:   No sharp objects. This includes scissors, razors, scrapers, and putty knives.   Chemicals should be removed and locked up.   Medications should be removed and locked up.   Weapons should be removed and locked up. This includes firearms, knives and instruments that can be used to cause injury.   The patient should abstain from use of illicit substances/drugs and abuse of any medications.  If symptoms worsen or do not continue to improve or if the patient becomes actively suicidal or homicidal then it is recommended that the patient return to the closest hospital emergency department, the [facility], or call 911 for further evaluation and treatment. National Suicide Prevention Lifeline 1-800-SUICIDE or 479-148-4548.  About 988 988 offers 24/7 access to trained crisis counselors who can help people experiencing mental health-related distress. People can call or text 988 or chat 988lifeline.org for themselves or if they are worried about a loved one who may need crisis support.   You are encouraged to follow up with [facility] for outpatient treatment.  [facility] [address] 2nd floor [place], [place] [phone]  Partial hospitalization program: Monday through Thursday 9 am-2 pm and Friday 9 am-1pm. Provides medication management and counseling services as well.   Autism Services/Providers   The following are clinicians within [facility] who are supposed to be specialized in working with individuals who have autism, according the [facility] Provider Directory- [email or web address].   Delon Caffey Gagne [address] [place], [place], [address] [phone] phone   Mitzie Jenkins Fisherman [address] [place], [place], [address] [phone] phone   Sonny Ethelene Sanders [id number] [place], [place], [address] [phone] phone   Ronal Carlyon Monte [address] [place], [place], [address] [phone] phone   Vickie Rommie Side [address] [place], [place], [address] [phone] phone   Elsie salinas Cantrell [address] [place], [place], [address] [phone] phone   [facility] [place] [address], [place], [address] [phone] phone   It is extremely important for caregivers to link with support groups to lessen the feeling of being isolated with this and for validation. Below is a support group for caregivers and family who have loved ones  with ASD. The Autism Society of [place]  can also provide additional resources that would be helpful. This organization does have a camp for children and adolescents with ASD to meet, socialize, and engage in activities together. Also check out where they may be able to also help with placement as well as assessments and therapy.   [facility] for [facility] and Autism Services  [address], [place], [address] [phone] phone Includes: Early Intervention, General Treatment for ASD, Classroom Readiness, Social Skills Groups, and Group Family Training   Autism Society of [place]  - [facility]) [address] [place], [place], [address] [phone] phone   [facility] [place] [address] [place], [place], [address] [phone] phone   [facility] [address] [place], [place], [address] [phone] phone   Autism Society of [place]  [facility] Find a Chapter/Support Group Chapters and Support Groups provide a place for families who face similar challenges to feel understood as they offer each other encouragement. [email or web address] -refurbishedbikes.be   [email or web address].[email or web address].[email or web address] For more information about events and meetups, please see the calendar, contact the Chapter by email, or join the Facebook group. https://www.[email or web address]-[email or web address]

## 2023-12-23 NOTE — Progress Notes (Signed)
°   12/23/23 1222  [facility] Triage Screening (Walk-ins at [facility] only)  How Did You Hear About Us ? Family/Friend  What Is the Reason for Your Visit/Call Today? Edward Yu is a 33Y male presenting to [facility] as a vol walk-in. Pt states he is here today because he has had a lot mentally going on. Pt states that he has been over obssessing over things and feels like everything is against him. Pt states he has been dealing with work stress and it bothers him so much he cannot stop thinking about certain event. Pt states he was seeing a therapist and taking medication and felt like it was helping but he was dropped. Pt has a hx of ADHD, Autism, and Bipolar 1. Pt denies SI, HI, AVH, and substance use.  How Long Has This Been Causing You Problems? 1 wk - 1 month  Have You Recently Had Any Thoughts About Hurting Yourself? No  Are You Planning to Commit Suicide/Harm Yourself At This time? No  Have you Recently Had Thoughts About Hurting Someone [name]? No  Are You Planning To Harm Someone At This Time? No  Physical Abuse Denies  Verbal Abuse Denies  Sexual Abuse Denies  Exploitation of patient/patient's resources Denies  Are you currently experiencing any auditory, visual or other hallucinations? No  Have You Used Any Alcohol or Drugs in the Past 24 Hours? No  Do you have any current medical co-morbidities that require immediate attention? No  What Do You Feel Would Help You the Most Today? Treatment for Depression or other mood problem  Determination of Need Routine (7 days)  Options For Referral Outpatient Therapy;Medication Management

## 2023-12-24 NOTE — ED Provider Notes (Incomplete)
 Behavioral Health Urgent Care Medical Screening Exam  Patient Name: Edward Yu MRN: [id number] Date of Evaluation: 12/23/2023 Chief Complaint:  My mind gets obsessed with certain things and I can't let it go.  Diagnosis:  Final diagnoses:  Obsessional thoughts  Generalized anxiety disorder  Delusions (HCC)    History of Present illness: Edward Yu is a 33 y.o. male presenting voluntarily, accompanied by his mother Edward Yu) as a walk-in to [facility] for an evaluation of chronic workplace related obsessions and anxiety. Case discussed with Dr. Cole. Past psychiatric hx includes ADHD, autism, GAD, MDD (recurrent episode, moderate), and unspecified mood disorder. He reports that his symptom began 1-2 years after the end of his relationship with his girlfriend and a subsequent ambiguous interaction with a male coworker. He shares that 1-2 years ago there was a lady at work armed forces operational officer with him, so he flirted back but then she told him that she had a boyfriend. Patient reports still he wasn't sure if she was interested in him, so he asked her directly and since then he reports coworker saying things like he harasses people. The patient has experienced recurrent, intrusive thoughts about being targeted and mistreated by co-workers. He endorses that his coworkers have bee messing and picking on him. He reports that his coworkers think I'm a horrible person and that he talks to others like they're stupid, and thinks he's better then everyone because he got a fast promotion. He reports that his mind is constantly running at work and recently got worse. This has caused him increased anxiety and preoccupation with fears of losing his job. He endorses poor sleep due to racing thoughts and mother shares that she thinks he has been sleeping a lot during the day. He has expressed a strong desire to leave his job and start a new one and mother reports that he has sought assistance from her with his  resume. He continues to attend work despite marked distress and social isolation. 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  The patient denies suicidal and homicidal ideations. He denies recent and past self-injurious and non-suicidal self-injurious behaviors. Denies any hx of suicide attempts. Denies auditory and visual hallucinations. Doesn't appear to be responding to internal or external stimuli. Mother reports that he was treated previously a year ago for bipolar, but not diagnosed with it. He denies symptoms of mania besides racing thoughts and sleep disturbances. Denies distractibility, elevated self-esteem/grandiosity, increased activity or risky behaviors. The patient recently restarted his previous psychiatric medications prescribed by Redell Pizza, NP with [facility] who he last saw 05/2023. This regimen includes trazodone  50 mg by mouth at bedtime for mood, vrylar 1.5 mg by mouth daily for mood stabilization, and vilazodone  20 mg by mouth for MDD/GAD. He reports taking it consistently for the past 3 days. He is not currently seeing a psychiatrist or therapist. The patient denies substance use including drugs and alcohol. Reports no access to firearms and weapons. He resides with his girlfriend and 2 children (ages 75 and 26). Coping strategies include playing the guitar, spending time with his gf (Trish), watching movies, and playing games.   He was assessed separately from his mother. Mother corroborated the recent worsening of obsessive symptoms, highlighted increased anxiety about being targeted at by coworkers. There may be some social anxiety linked to his autism. His mother reports that he is a toe walker. Psychoeducation was provided regarding medication adherence, the need to re-establish psychiatric care, and the overlap of mood, ADHD,  and autism spectrum symptoms as they can share features such as difficulties with emotional regulation, anxiety, social challenges, and intrusive thoughts, making  it important to differentiate the source of symptoms for targeted treatment. This would require a comprehensive psychiatric evaluation by a mental health provider. Discussed with the patient and mother that patient is not at an imminent risk of harm to himself or others. Recommendation to follow-up with resources provided for re-establishing care with psychiatric services (medication management/counseling) and option to attend a partial-hospitalization program.   Flowsheet Row ED from 12/23/2023 in [facility]  C-SSRS RISK CATEGORY No Risk    Psychiatric Specialty Exam  Presentation  General Appearance:Appropriate for Environment; Casual  Eye Contact:Fair  Speech:Clear and Coherent  Speech Volume:Normal  Handedness:Right   Mood and Affect  Mood: Anxious; Depressed  Affect: Appropriate; Congruent   Thought Process  Thought Processes: Coherent  Descriptions of Associations:Intact  Orientation:Full (Time, Place and Person)  Thought Content:Obsessions; Delusions  Diagnosis of Schizophrenia or Schizoaffective disorder in past: No   Hallucinations:None  Ideas of Reference:None  Suicidal Thoughts:No  Homicidal Thoughts:No   Sensorium  Memory: Immediate Good  Judgment: Fair  Insight: Fair   Chartered Certified Accountant: Fair  Attention Span: Fair  Recall: [employer] of Knowledge: Fair  Language: Fair   Psychomotor Activity  Psychomotor Activity: Normal   Assets  Assets: Manufacturing Systems Engineer; Desire for Improvement; Financial Resources/Insurance; Housing; Physical Health; Social Support; Resilience   Sleep  Sleep: Fair  Number of hours: No data recorded  Physical Exam: Physical Exam Vitals and nursing note reviewed.  Constitutional:      Appearance: Normal appearance.  HENT:     Head: Normocephalic.     Nose: Nose normal.  Cardiovascular:     Rate and Rhythm: Normal rate.  Pulmonary:      Effort: Pulmonary effort is normal.  Musculoskeletal:        General: Normal range of motion.     Cervical back: Normal range of motion.  Neurological:     Mental Status: He is alert and oriented to person, place, and time.  Psychiatric:        Attention and Perception: Attention normal. He does not perceive auditory or visual hallucinations.        Mood and Affect: Mood is anxious and depressed.        Speech: Speech normal.        Behavior: Behavior normal. Behavior is cooperative.        Thought Content: Thought content does not include homicidal or suicidal ideation. Thought content does not include homicidal or suicidal plan.        Judgment: Judgment normal.    Review of Systems  Constitutional: Negative.   HENT: Negative.    Eyes: Negative.   Respiratory: Negative.    Cardiovascular: Negative.   Gastrointestinal: Negative.   Genitourinary: Negative.   Musculoskeletal: Negative.   Skin: Negative.   Neurological: Negative.   Endo/Heme/Allergies: Negative.   Psychiatric/Behavioral:  Positive for depression. Negative for hallucinations, memory loss, substance abuse and suicidal ideas. The patient is nervous/anxious and has insomnia.    Blood pressure (!) 137/97, pulse 62, temperature 98.4 F (36.9 C), temperature source Oral, resp. rate 18, SpO2 99%. There is no height or weight on file to calculate BMI.  Musculoskeletal: Strength & Muscle Tone: within normal limits Gait & Station: normal Patient leans: N/A   [facility] MSE Discharge Disposition for Follow up and Recommendations: Based on my evaluation  the patient does not appear to have an emergency medical condition and can be discharged with resources and follow up care in outpatient services for Medication Management, Partial Hospitalization Program, Individual Therapy, and Group Therapy   Dayanna Pryce, NP 12/23/2023, 11:44 PM

## 2023-12-29 ENCOUNTER — Encounter (HOSPITAL_COMMUNITY): Payer: Self-pay

## 2023-12-29 ENCOUNTER — Ambulatory Visit (HOSPITAL_COMMUNITY)

## 2023-12-29 ENCOUNTER — Telehealth (HOSPITAL_COMMUNITY): Payer: Self-pay | Admitting: Licensed Clinical Social Worker

## 2023-12-29 NOTE — Telephone Encounter (Signed)
 Cln called to f/u on missed virtual PHP intake appt. Call rang and ended with no message option. Cln stayed in virtual meeting space until 10:22 and pt did not present.

## 2024-01-05 DIAGNOSIS — F330 Major depressive disorder, recurrent, mild: Secondary | ICD-10-CM | POA: Diagnosis not present

## 2024-01-05 DIAGNOSIS — Z5181 Encounter for therapeutic drug level monitoring: Secondary | ICD-10-CM | POA: Diagnosis not present

## 2024-01-05 DIAGNOSIS — F900 Attention-deficit hyperactivity disorder, predominantly inattentive type: Secondary | ICD-10-CM | POA: Diagnosis not present

## 2024-01-05 DIAGNOSIS — F411 Generalized anxiety disorder: Secondary | ICD-10-CM | POA: Diagnosis not present

## 2024-02-04 ENCOUNTER — Ambulatory Visit: Admitting: Family Medicine

## 2024-02-04 ENCOUNTER — Encounter: Payer: Self-pay | Admitting: Family Medicine

## 2024-02-04 VITALS — BP 118/80 | HR 100 | Temp 98.8°F | Ht 70.0 in | Wt 261.8 lb

## 2024-02-04 DIAGNOSIS — H6121 Impacted cerumen, right ear: Secondary | ICD-10-CM

## 2024-02-04 DIAGNOSIS — G4483 Primary cough headache: Secondary | ICD-10-CM | POA: Diagnosis not present

## 2024-02-04 DIAGNOSIS — U071 COVID-19: Secondary | ICD-10-CM | POA: Diagnosis not present

## 2024-02-04 LAB — POCT INFLUENZA A/B
Influenza A, POC: NEGATIVE
Influenza B, POC: NEGATIVE

## 2024-02-04 LAB — POC COVID19 BINAXNOW: SARS Coronavirus 2 Ag: POSITIVE — AB

## 2024-02-04 NOTE — Progress Notes (Signed)
 "  Established Patient Office Visit   Subjective:  Patient ID: Edward Yu, male    DOB: November 18, 1990  Age: 34 y.o. MRN: [id number]  Chief Complaint  Patient presents with   Acute Visit    Fevers and Body aches for     HPI Encounter Diagnoses  Name Primary?   Cough headache Yes   COVID    Excessive cerumen in right ear canal    1 day history of fever chills, nasal congestion, mild sore throat, light nonproductive cough, body aches.  Denies wheezing or difficulty breathing.  No history of asthma.   Review of Systems  Constitutional: Negative.   HENT:  Positive for congestion and sore throat. Negative for ear pain and sinus pain.   Eyes:  Negative for blurred vision, discharge and redness.  Respiratory:  Positive for cough. Negative for sputum production, shortness of breath and wheezing.   Cardiovascular: Negative.   Gastrointestinal:  Negative for abdominal pain and diarrhea.  Genitourinary: Negative.   Musculoskeletal:  Positive for myalgias.  Skin:  Negative for rash.  Neurological:  Negative for tingling, loss of consciousness and weakness.  Endo/Heme/Allergies:  Negative for polydipsia.    Current Medications[1]   Objective:     BP 118/80   Pulse 100   Temp 98.8 F (37.1 C)   Ht 5' 10 (1.778 m)   Wt 261 lb 12.8 oz (118.8 kg)   SpO2 96%   BMI 37.56 kg/m    Physical Exam Constitutional:      General: He is not in acute distress.    Appearance: Normal appearance. He is not ill-appearing, toxic-appearing or diaphoretic.  HENT:     Head: Normocephalic and atraumatic.     Right Ear: Tympanic membrane and external ear normal.     Left Ear: Tympanic membrane and external ear normal.     Mouth/Throat:     Mouth: Mucous membranes are moist.     Pharynx: Oropharynx is clear. No oropharyngeal exudate or posterior oropharyngeal erythema.  Eyes:     General: No scleral icterus.       Right eye: No discharge.        Left eye: No discharge.     Extraocular  Movements: Extraocular movements intact.     Conjunctiva/sclera: Conjunctivae normal.     Pupils: Pupils are equal, round, and reactive to light.  Cardiovascular:     Rate and Rhythm: Normal rate and regular rhythm.  Pulmonary:     Effort: Pulmonary effort is normal. No respiratory distress.     Breath sounds: Normal breath sounds. No wheezing or rales.  Abdominal:     General: Bowel sounds are normal.  Musculoskeletal:     Cervical back: No rigidity or tenderness.  Skin:    General: Skin is warm and dry.  Neurological:     Mental Status: He is alert and oriented to person, place, and time.  Psychiatric:        Mood and Affect: Mood normal.        Behavior: Behavior normal.      Results for orders placed or performed in visit on 02/04/24  POC COVID-19  Result Value Ref Range   SARS Coronavirus 2 Ag Positive (A) Negative  POCT Influenza A/B  Result Value Ref Range   Influenza A, POC Negative Negative   Influenza B, POC Negative Negative      The ASCVD Risk score (Arnett DK, et al., 2019) failed to calculate for the following reasons:  The 2019 ASCVD risk score is only valid for ages 79 to 67   * - Cholesterol units were assumed    Assessment & Plan:   Cough headache -     POC COVID-19 BinaxNow -     POCT Influenza A/B  COVID  Excessive cerumen in right ear canal    Return Return if not improving within a week..  Testing for COVID was positive.  Discussed strategies for symptom relief such as Tylenol, NyQuil DayQuil as needed.  Stay at home and rest.  Hydrate well follow-up in a week if not improving.  Information given on COVID.  Information given on ceruminosis and ear irrigation.  Advised him not to use Q-tips.  Elsie Sim Lent, MD    [1]  Current Outpatient Medications:    cariprazine  (VRAYLAR ) 1.5 MG capsule, Take 1 capsule (1.5 mg total) by mouth daily., Disp: 30 capsule, Rfl: 2   traZODone  (DESYREL ) 50 MG tablet, Take 1 tablet (50 mg total) by  mouth at bedtime., Disp: 90 tablet, Rfl: 1   Vilazodone  HCl 20 MG TABS, Take one tablet by mouth daily. Must take with food., Disp: 90 tablet, Rfl: 1   chlorhexidine  (HIBICLENS ) 4 % external liquid, Apply a thin coat to entire body from neck down and wash off in shower 45 minutes later.  Repeat in 4 days. (Patient not taking: Reported on 02/04/2024), Disp: 236 mL, Rfl: 1   clindamycin  (CLEOCIN ) 300 MG capsule, Take 1 capsule (300 mg total) by mouth 3 (three) times daily. (Patient not taking: Reported on 02/04/2024), Disp: 21 capsule, Rfl: 0   mupirocin  ointment (BACTROBAN ) 2 %, Apply one half a gram to each nare twice daily for 5 days. (Patient not taking: Reported on 02/04/2024), Disp: 10 g, Rfl: 0  "

## 2024-02-10 ENCOUNTER — Encounter: Payer: Self-pay | Admitting: Family Medicine
